# Patient Record
Sex: Male | Born: 1975 | Race: White | Hispanic: No | Marital: Single
Health system: Midwestern US, Academic
[De-identification: ages and names within clinical notes are randomized; demographics above are authoritative.]

---

## 2016-12-16 MED ORDER — LAMOTRIGINE 100 MG PO TAB
100 mg | ORAL_TABLET | Freq: Two times a day (BID) | ORAL | 11 refills | Status: DC
Start: 2016-12-16 — End: 2017-06-23

## 2016-12-16 MED ORDER — LEVETIRACETAM 100 MG/ML PO SOLN
2000 mg | Freq: Two times a day (BID) | ORAL | 11 refills | 90.00000 days | Status: DC
Start: 2016-12-16 — End: 2017-06-23

## 2016-12-16 MED ORDER — GLYCOPYRROLATE 1 MG PO TAB
1 mg | ORAL_TABLET | Freq: Three times a day (TID) | ORAL | 11 refills | 90.00000 days | Status: DC
Start: 2016-12-16 — End: 2017-02-01

## 2017-02-01 MED ORDER — DENOSUMAB 60 MG/ML SC SYRG
60 mg | Freq: Once | SUBCUTANEOUS | 0 refills | Status: CN
Start: 2017-02-01 — End: ?

## 2017-02-01 MED ORDER — LEVOTHYROXINE 50 MCG PO TAB
50 ug | ORAL_TABLET | Freq: Every day | ORAL | 3 refills | 30.00000 days | Status: DC
Start: 2017-02-01 — End: 2018-02-28

## 2017-02-23 ENCOUNTER — Encounter: Admit: 2017-02-23 | Discharge: 2017-02-23 | Payer: MEDICARE

## 2017-03-01 MED ORDER — DENOSUMAB 60 MG/ML SC SYRG
60 mg | Freq: Once | SUBCUTANEOUS | 0 refills | Status: CN
Start: 2017-03-01 — End: ?

## 2017-03-01 MED ORDER — DENOSUMAB 60 MG/ML SC SYRG
60 mg | Freq: Once | SUBCUTANEOUS | 0 refills | Status: CP
Start: 2017-03-01 — End: ?
  Administered 2017-03-01: 20:00:00 60 mg via SUBCUTANEOUS

## 2017-03-02 ENCOUNTER — Ambulatory Visit: Admit: 2017-03-01 | Discharge: 2017-03-02 | Payer: MEDICARE

## 2017-03-02 DIAGNOSIS — M818 Other osteoporosis without current pathological fracture: Principal | ICD-10-CM

## 2017-03-02 DIAGNOSIS — M810 Age-related osteoporosis without current pathological fracture: ICD-10-CM

## 2017-05-23 ENCOUNTER — Encounter: Admit: 2017-05-23 | Discharge: 2017-05-23 | Payer: MEDICARE

## 2017-05-23 ENCOUNTER — Ambulatory Visit: Admit: 2017-05-23 | Discharge: 2017-05-23 | Payer: MEDICARE

## 2017-05-23 DIAGNOSIS — K9423 Gastrostomy malfunction: Principal | ICD-10-CM

## 2017-05-23 MED ORDER — SODIUM CHLORIDE 0.9 % IV SOLP
INTRAVENOUS | 0 refills | Status: CN
Start: 2017-05-23 — End: ?

## 2017-05-27 ENCOUNTER — Ambulatory Visit: Admit: 2017-05-27 | Discharge: 2017-05-28 | Payer: MEDICARE

## 2017-05-27 ENCOUNTER — Ambulatory Visit: Admit: 2017-05-27 | Discharge: 2017-05-27 | Payer: MEDICARE

## 2017-05-27 DIAGNOSIS — Z431 Encounter for attention to gastrostomy: Principal | ICD-10-CM

## 2017-05-27 DIAGNOSIS — Q909 Down syndrome, unspecified: ICD-10-CM

## 2017-05-27 MED ORDER — DIATRIZOATE MEG-DIATRIZOAT SOD 66-10 % PO SOLN
30 mL | Freq: Once | ORAL | 0 refills | Status: CP
Start: 2017-05-27 — End: ?
  Administered 2017-05-27: 21:00:00 30 mL via ORAL

## 2017-05-27 NOTE — Procedures
Blue line consult    Brief procedure note:    Chief Complaint: Patient is here for feeding tube replacement.    There were no vitals taken for this visit.    Focused physical exam: erythema around the PEG tube, no oozing or bleeding.    Diagnosis: Down syndrome    Interventions: Prior tube was 18 Fr 2.4 cm Bard Button tube. This old tube was removed with no complications. New Bard Button G tube 18 Fr 2.4 cm placed without complications. KUB with Gastrografin ordered for PEG tube placement check.    Recommendations: can start using the tube after PEG check is complete.  Discharge Instructions: call if bleeding, oozing, or peri-tube leakage.    Case was done with Dr. Camillo Flaming.

## 2017-05-27 NOTE — Progress Notes
GI Endoscopy Walk-In Patient Nursing Assessment    Nurse:    Chief Complaint:    Condition: Chronic__x_   At Risk___  Emergency___    BP 108/56 (BP Source: Arm, Left Upper)  - Temp 36.6 C (97.9 F)  - SpO2 (!) 85%     n/a    Mental Status: Alert_x_   Oriented__   Agitated__   Disoriented__    General Appearance: Patient a 41 year old non-verbal male, who did not appear in distress. Skin color pale, skin turgor normal. No rashes or lesions.       Pain Level (scale from 1-10): Not able to assess. Patient did not appear to be in any pain.     Pain Description: n/a     Time of Last pain medication: n/a.     Peg tube replaced by Dr. Donavan Foil. X-ray with gastrografin confirmed placement post procedure. This RN administered the gastrografin via peg tube. Patient left clinic in his personal wheelchair and accompanied by his parents. He was in no distress upon leaving clinic.

## 2017-06-23 ENCOUNTER — Encounter: Admit: 2017-06-23 | Discharge: 2017-06-23 | Payer: MEDICARE

## 2017-06-23 ENCOUNTER — Ambulatory Visit: Admit: 2017-06-23 | Discharge: 2017-06-24 | Payer: MEDICARE

## 2017-06-23 MED ORDER — CLORAZEPATE DIPOTASSIUM 3.75 MG PO TAB
3.75 mg | ORAL_TABLET | Freq: Two times a day (BID) | ORAL | 5 refills | 6.00000 days | Status: AC
Start: 2017-06-23 — End: 2017-12-22

## 2017-06-23 MED ORDER — LEVETIRACETAM 100 MG/ML PO SOLN
ORAL | 11 refills | 90.00000 days | Status: AC
Start: 2017-06-23 — End: 2017-06-27

## 2017-06-23 MED ORDER — GLYCOPYRROLATE 1 MG PO TAB
1 mg | ORAL_TABLET | Freq: Three times a day (TID) | ORAL | 11 refills | 90.00000 days | Status: AC
Start: 2017-06-23 — End: 2017-12-22

## 2017-06-23 MED ORDER — LAMOTRIGINE 100 MG PO TAB
100 mg | ORAL_TABLET | Freq: Two times a day (BID) | ORAL | 11 refills | Status: AC
Start: 2017-06-23 — End: 2017-12-22

## 2017-06-23 NOTE — Progress Notes
Date of Service: 06/23/2017    Subjective:             Marcus Gibbs is a 41 y.o. male.    History of Present Illness  Marcus Gibbs presents for follow up today, accompanied by his parents. He is doing well. His parents--Marcus Gibbs and Marcus Gibbs mention a new concern for episodes which occur several times a day. Mom describes episodes as increasing in the last 6 months and sound similar to Marcus Gibbs previous note. Described as extension of the back and neck with a surprised look on his face with eyes wide open and mouth open. Eyes do not roll back. Patient's arm also flex and shake in an oscillatory pattern while flexed. Parents report episodes occur >5 times a day and each episode defined as a series of 5 shaking events over the course of 5 minutes---each instance lasting 30-40 seconds.  These are different from prior seizures of starring spells or GTC; last was at least 6 months ago. Continues on 2g keppra bid and 100mg  lamictal bid. Parents report that when he was started on keppra, he had some increase agitation which they continue to notice with increased pinching and scratching.     Parents also report pt occasionally has difficulty sleeping.   ???  He is still going to the [facility] for PT/OT.     Patient has followed with Marcus Gibbs since he was 41 years old. He first had seizure episodes when he was a baby and parents describe as petite mal. Parents report only having 4 grand mal in his lifetime.        Review of Systems  Negative/unable to be performed by patient due to Mental status.  Family reports difficulty sleeping.    Objective:         ??? bisacodyl (DULCOLAX) 10 mg rectal suppository Insert or Apply 10 mg to rectal area as directed daily as needed.   ??? Calcium Carbonate (TUMS EXTRA STRENGTH SMOOTHIES) 300 mg (750 mg) PO Chew Take 1 Tab via feeding tube daily. Crushed/feeding tube   ??? chlorhexidine gluconate (PERIDEX) 0.12 % solution Take 15 mL by mouth twice daily. ??? cholecalciferol (VITAMIN D-3) 1,000 units tablet by PEG Tube route twice daily. Patient is taking (2) tablespoons twice a week   ??? denosumab (PROLIA) 60 mg/mL syrg Inject 60 mg into area(s) as directed once. Indications: every 6 months   ??? fluticasone (FLONASE) 50 mcg/actuation nasal spray Apply 2 Sprays to each nostril as directed daily.   ??? LACTOSE-REDUCED FOOD (ENSURE PO) Take  by mouth. Patient is using (2) cans of this a day   ??? lamoTRIgine (LAMICTAL) 100 mg tablet Take 1 tablet by mouth twice daily. Take 1 tab twice daily via feeding tube   ??? levETIRAcetam (KEPPRA) 100 mg/mL oral solution Take 20 mL by mouth twice daily. Via feeding gastrostomy (Patient taking differently: Via feeding gastrostomy)   ??? levothyroxine (SYNTHROID) 50 mcg tablet Take 1 tablet by mouth daily 30 minutes before breakfast.   ??? Menthol-Zinc Oxide (CALMOSEPTINE) 0.44-20.6 % oint Apply  to affected area daily.   ??? Nutritional Supplements (CARNATION INSTANT BREAKFAST) PO Liqd Take  by mouth. 1/2 PACKAGE OF INSTANT BREAKFAST AT 9AM IN ADDITION  TO BREAKFAST, LUNCH AND SNACKS;1/2 PACKAGE OF INSTANT BREAKFAST AT 5PM, INSTANT BREAKFAST IS MIXED WITH PEDIASURE.IF Marcus Gibbs IS STILL HUNGRY AFTER 5PM HE RECEIVES MORE. Marcus Gibbs RECEIVES ALL MEDICATIONS (CRUSHED WHEN REQUIRED) IN FEEDING TUBE    ??? ranitidine(+) (ZANTAC) 15 mg/mL oral  syrup Take 75 mg by mouth twice daily.     Vitals:    06/23/17 1247   BP: 95/56   Pulse: 44   Weight: 32.4 kg (71 lb 6.4 oz)   Height: 140 cm (55.1)     Body mass index is 16.53 kg/m???.     Physical Exam  Gen: Alert and cooperative. NAD.  ???  Neuro exam:  MSE: Alert. Does not answer questions. Reaches towards objects within his grasp.  Speech: Non verbal.  CN: Pupils 3mm and reactive bilaterally. Tracks light with eyes with apparent full movements. Face symmetric.  Motor: Normal bulk and tone. Spontaneous movements of all limbs. Strength intact and full BUE, at least 4 in BLE. Reflexes: 2 and symmetric at biceps, brachioradialis, patellas.   Sensory: Normal light touch in all limbs.  Coordination: No ataxia with spontaneous arm movements.  Gait: Not tested.  ???  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 No clear seizures for 6 months. Remains on 2g keppra bid and 100mg  lamictal bid without side effects. Doing well overall. Remains on 1mg  glycopyrrolate tid for sialorrhea. Refills for lamictal, keppra, and glycopyrrolate have been sent to his pharmacy.     For agitation and sleep with start Tranxene 3.75mg  qhs for 5 days, then BID.     Labs ordered: Keppra level, Lamictal level, CMP, CBC, will follow up on results  ???  RTC in 6 months.  Patient was seen and discussed with Dr. Genevie Cheshire.

## 2017-06-24 ENCOUNTER — Encounter: Admit: 2017-06-24 | Discharge: 2017-06-24 | Payer: MEDICARE

## 2017-06-24 DIAGNOSIS — G40319 Generalized idiopathic epilepsy and epileptic syndromes, intractable, without status epilepticus: Principal | ICD-10-CM

## 2017-06-27 ENCOUNTER — Encounter: Admit: 2017-06-27 | Discharge: 2017-06-27 | Payer: MEDICARE

## 2017-06-27 DIAGNOSIS — G40319 Generalized idiopathic epilepsy and epileptic syndromes, intractable, without status epilepticus: Principal | ICD-10-CM

## 2017-06-27 LAB — LAMICTAL LEVEL: Lab: 11 ug/mL (ref >=60)

## 2017-06-27 LAB — KEPPRA (LEVETIRACETAM): Lab: 102 ug/mL — ABNORMAL HIGH (ref >=60)

## 2017-06-27 LAB — COMPREHENSIVE METABOLIC PANEL: Lab: 75 mg/dL (ref 65–99)

## 2017-06-27 LAB — CBC: Lab: 5.9 10*3/uL (ref 3.8–10.8)

## 2017-06-27 MED ORDER — LEVETIRACETAM 100 MG/ML PO SOLN
Freq: Two times a day (BID) | ORAL | 11 refills | 90.00000 days | Status: AC
Start: 2017-06-27 — End: 2017-12-22

## 2017-07-21 NOTE — Progress Notes
I have personally interviewed and examined Marcus HopeScott E Mckissack and reviewed the history, examination, impression, and plan of care as outlined by Dr.Michael Jeanell Sparrow Bellew, MD. I personally  participated in patient counseling and coordination of care.  I agree with the assessment and plan as documented by Dr. Noemi ChapelMichael R Bellew, MD.  The Tranxene was added for its antiepileptic properties as well.

## 2017-08-03 ENCOUNTER — Encounter: Admit: 2017-08-03 | Discharge: 2017-08-03 | Payer: MEDICARE

## 2017-08-24 ENCOUNTER — Encounter: Admit: 2017-08-24 | Discharge: 2017-08-24 | Payer: MEDICARE

## 2017-08-31 ENCOUNTER — Encounter: Admit: 2017-08-31 | Discharge: 2017-08-31 | Payer: MEDICARE

## 2017-08-31 DIAGNOSIS — Q899 Congenital malformation, unspecified: ICD-10-CM

## 2017-08-31 DIAGNOSIS — H9190 Unspecified hearing loss, unspecified ear: ICD-10-CM

## 2017-08-31 DIAGNOSIS — M810 Age-related osteoporosis without current pathological fracture: ICD-10-CM

## 2017-08-31 DIAGNOSIS — Z931 Gastrostomy status: ICD-10-CM

## 2017-08-31 DIAGNOSIS — K319 Disease of stomach and duodenum, unspecified: ICD-10-CM

## 2017-08-31 DIAGNOSIS — K3184 Gastroparesis: ICD-10-CM

## 2017-08-31 DIAGNOSIS — R569 Unspecified convulsions: Secondary | ICD-10-CM

## 2017-08-31 DIAGNOSIS — Q909 Down syndrome, unspecified: Principal | ICD-10-CM

## 2017-08-31 DIAGNOSIS — K5909 Other constipation: ICD-10-CM

## 2017-08-31 DIAGNOSIS — E039 Hypothyroidism, unspecified: ICD-10-CM

## 2017-08-31 DIAGNOSIS — G809 Cerebral palsy, unspecified: ICD-10-CM

## 2017-08-31 MED ORDER — DENOSUMAB 60 MG/ML SC SYRG
60 mg | Freq: Once | SUBCUTANEOUS | 0 refills | Status: CN
Start: 2017-08-31 — End: ?

## 2017-08-31 MED ORDER — DENOSUMAB 60 MG/ML SC SYRG
60 mg | Freq: Once | SUBCUTANEOUS | 0 refills | Status: CP
Start: 2017-08-31 — End: ?
  Administered 2017-08-31: 19:00:00 60 mg via SUBCUTANEOUS

## 2017-08-31 NOTE — Progress Notes
Pt received injection, no issues.   Parents helped hold down arm for injection.

## 2017-09-01 ENCOUNTER — Ambulatory Visit: Admit: 2017-08-31 | Discharge: 2017-09-01 | Payer: MEDICARE

## 2017-09-01 DIAGNOSIS — M810 Age-related osteoporosis without current pathological fracture: ICD-10-CM

## 2017-09-01 DIAGNOSIS — M818 Other osteoporosis without current pathological fracture: Principal | ICD-10-CM

## 2017-12-08 ENCOUNTER — Encounter: Admit: 2017-12-08 | Discharge: 2017-12-08 | Payer: MEDICARE

## 2017-12-08 DIAGNOSIS — H9190 Unspecified hearing loss, unspecified ear: ICD-10-CM

## 2017-12-08 DIAGNOSIS — Q909 Down syndrome, unspecified: Principal | ICD-10-CM

## 2017-12-08 DIAGNOSIS — G809 Cerebral palsy, unspecified: ICD-10-CM

## 2017-12-08 DIAGNOSIS — Q899 Congenital malformation, unspecified: ICD-10-CM

## 2017-12-08 DIAGNOSIS — K319 Disease of stomach and duodenum, unspecified: ICD-10-CM

## 2017-12-08 DIAGNOSIS — E039 Hypothyroidism, unspecified: ICD-10-CM

## 2017-12-08 DIAGNOSIS — K3184 Gastroparesis: ICD-10-CM

## 2017-12-08 DIAGNOSIS — Z931 Gastrostomy status: ICD-10-CM

## 2017-12-08 DIAGNOSIS — M810 Age-related osteoporosis without current pathological fracture: ICD-10-CM

## 2017-12-08 DIAGNOSIS — R569 Unspecified convulsions: ICD-10-CM

## 2017-12-08 DIAGNOSIS — K5909 Other constipation: ICD-10-CM

## 2017-12-22 ENCOUNTER — Ambulatory Visit: Admit: 2017-12-22 | Discharge: 2017-12-23 | Payer: MEDICARE

## 2017-12-22 ENCOUNTER — Encounter: Admit: 2017-12-22 | Discharge: 2017-12-22 | Payer: MEDICARE

## 2017-12-22 DIAGNOSIS — K319 Disease of stomach and duodenum, unspecified: ICD-10-CM

## 2017-12-22 DIAGNOSIS — H9190 Unspecified hearing loss, unspecified ear: ICD-10-CM

## 2017-12-22 DIAGNOSIS — M810 Age-related osteoporosis without current pathological fracture: ICD-10-CM

## 2017-12-22 DIAGNOSIS — Q899 Congenital malformation, unspecified: ICD-10-CM

## 2017-12-22 DIAGNOSIS — Z931 Gastrostomy status: ICD-10-CM

## 2017-12-22 DIAGNOSIS — E039 Hypothyroidism, unspecified: ICD-10-CM

## 2017-12-22 DIAGNOSIS — R569 Unspecified convulsions: ICD-10-CM

## 2017-12-22 DIAGNOSIS — K5909 Other constipation: ICD-10-CM

## 2017-12-22 DIAGNOSIS — G809 Cerebral palsy, unspecified: ICD-10-CM

## 2017-12-22 DIAGNOSIS — Q909 Down syndrome, unspecified: Principal | ICD-10-CM

## 2017-12-22 DIAGNOSIS — K3184 Gastroparesis: ICD-10-CM

## 2017-12-22 MED ORDER — LAMOTRIGINE 100 MG PO TAB
100 mg | ORAL_TABLET | Freq: Two times a day (BID) | ORAL | 11 refills | Status: AC
Start: 2017-12-22 — End: 2018-05-25

## 2017-12-22 MED ORDER — LEVETIRACETAM 100 MG/ML PO SOLN
Freq: Two times a day (BID) | ORAL | 11 refills | 90.00000 days | Status: AC
Start: 2017-12-22 — End: 2018-05-25

## 2017-12-22 MED ORDER — CLORAZEPATE DIPOTASSIUM 3.75 MG PO TAB
3.75 mg | ORAL_TABLET | Freq: Three times a day (TID) | ORAL | 5 refills | 6.00000 days | Status: AC
Start: 2017-12-22 — End: 2018-05-25

## 2017-12-22 MED ORDER — GLYCOPYRROLATE 1 MG PO TAB
1 mg | ORAL_TABLET | Freq: Three times a day (TID) | ORAL | 11 refills | 90.00000 days | Status: AC
Start: 2017-12-22 — End: 2018-05-25

## 2017-12-23 DIAGNOSIS — G40909 Epilepsy, unspecified, not intractable, without status epilepticus: ICD-10-CM

## 2017-12-23 DIAGNOSIS — G40319 Generalized idiopathic epilepsy and epileptic syndromes, intractable, without status epilepticus: Principal | ICD-10-CM

## 2018-02-13 ENCOUNTER — Ambulatory Visit: Admit: 2018-02-13 | Discharge: 2018-02-13 | Payer: MEDICARE

## 2018-02-13 DIAGNOSIS — G40109 Localization-related (focal) (partial) symptomatic epilepsy and epileptic syndromes with simple partial seizures, not intractable, without status epilepticus: Principal | ICD-10-CM

## 2018-02-27 ENCOUNTER — Encounter: Admit: 2018-02-27 | Discharge: 2018-02-27 | Payer: MEDICARE

## 2018-02-27 DIAGNOSIS — M810 Age-related osteoporosis without current pathological fracture: Principal | ICD-10-CM

## 2018-02-28 ENCOUNTER — Encounter: Admit: 2018-02-28 | Discharge: 2018-02-28 | Payer: MEDICARE

## 2018-02-28 ENCOUNTER — Ambulatory Visit: Admit: 2018-02-28 | Discharge: 2018-02-28 | Payer: MEDICARE

## 2018-02-28 DIAGNOSIS — K319 Disease of stomach and duodenum, unspecified: ICD-10-CM

## 2018-02-28 DIAGNOSIS — Z931 Gastrostomy status: ICD-10-CM

## 2018-02-28 DIAGNOSIS — Q909 Down syndrome, unspecified: Principal | ICD-10-CM

## 2018-02-28 DIAGNOSIS — R569 Unspecified convulsions: ICD-10-CM

## 2018-02-28 DIAGNOSIS — K3184 Gastroparesis: ICD-10-CM

## 2018-02-28 DIAGNOSIS — M810 Age-related osteoporosis without current pathological fracture: ICD-10-CM

## 2018-02-28 DIAGNOSIS — E038 Other specified hypothyroidism: Principal | ICD-10-CM

## 2018-02-28 DIAGNOSIS — H9190 Unspecified hearing loss, unspecified ear: ICD-10-CM

## 2018-02-28 DIAGNOSIS — G809 Cerebral palsy, unspecified: ICD-10-CM

## 2018-02-28 DIAGNOSIS — K5909 Other constipation: ICD-10-CM

## 2018-02-28 DIAGNOSIS — E039 Hypothyroidism, unspecified: ICD-10-CM

## 2018-02-28 DIAGNOSIS — Q899 Congenital malformation, unspecified: ICD-10-CM

## 2018-02-28 LAB — TSH WITH FREE T4 REFLEX: Lab: 0.9 uU/mL (ref 0.35–5.00)

## 2018-02-28 LAB — 25-OH VITAMIN D (D2 + D3): Lab: 47 ng/mL (ref 30–80)

## 2018-02-28 LAB — BASIC METABOLIC PANEL
Lab: 101 MMOL/L (ref 98–110)
Lab: 13 mg/dL (ref 7–25)
Lab: 138 MMOL/L (ref 137–147)
Lab: 3.8 MMOL/L (ref 3.5–5.1)
Lab: 31 MMOL/L — ABNORMAL HIGH (ref 21–30)
Lab: 85 mg/dL (ref 70–100)
Lab: 9.5 mg/dL (ref 8.5–10.6)
Lab: >60 mL/min (ref >60)
Lab: >60 mL/min (ref >60)
Lab: 6 (ref 3–12)

## 2018-02-28 MED ORDER — LEVOTHYROXINE 50 MCG PO TAB
50 ug | ORAL_TABLET | Freq: Every day | ORAL | 3 refills | 30.00000 days | Status: AC
Start: 2018-02-28 — End: 2018-02-28

## 2018-02-28 MED ORDER — DENOSUMAB 60 MG/ML SC SYRG
60 mg | Freq: Once | SUBCUTANEOUS | 0 refills | Status: CN
Start: 2018-02-28 — End: ?

## 2018-02-28 MED ORDER — LEVOTHYROXINE 50 MCG PO TAB
50 ug | ORAL_TABLET | Freq: Every day | ORAL | 3 refills | 30.00000 days | Status: AC
Start: 2018-02-28 — End: 2018-03-01

## 2018-03-01 ENCOUNTER — Encounter: Admit: 2018-03-01 | Discharge: 2018-03-01 | Payer: MEDICARE

## 2018-03-01 MED ORDER — LEVOTHYROXINE 50 MCG PO TAB
50 ug | ORAL_TABLET | Freq: Every day | ORAL | 3 refills | 30.00000 days | Status: AC
Start: 2018-03-01 — End: 2019-02-27

## 2018-03-03 ENCOUNTER — Encounter: Admit: 2018-03-03 | Discharge: 2018-03-03 | Payer: MEDICARE

## 2018-03-06 ENCOUNTER — Ambulatory Visit: Admit: 2018-03-06 | Discharge: 2018-03-07 | Payer: MEDICARE

## 2018-03-06 ENCOUNTER — Encounter: Admit: 2018-03-06 | Discharge: 2018-03-06 | Payer: MEDICARE

## 2018-03-06 DIAGNOSIS — K3184 Gastroparesis: ICD-10-CM

## 2018-03-06 DIAGNOSIS — Q909 Down syndrome, unspecified: Principal | ICD-10-CM

## 2018-03-06 DIAGNOSIS — G809 Cerebral palsy, unspecified: ICD-10-CM

## 2018-03-06 DIAGNOSIS — Q899 Congenital malformation, unspecified: ICD-10-CM

## 2018-03-06 DIAGNOSIS — M810 Age-related osteoporosis without current pathological fracture: ICD-10-CM

## 2018-03-06 DIAGNOSIS — E039 Hypothyroidism, unspecified: ICD-10-CM

## 2018-03-06 DIAGNOSIS — K319 Disease of stomach and duodenum, unspecified: ICD-10-CM

## 2018-03-06 DIAGNOSIS — K5909 Other constipation: ICD-10-CM

## 2018-03-06 DIAGNOSIS — H9190 Unspecified hearing loss, unspecified ear: ICD-10-CM

## 2018-03-06 DIAGNOSIS — Z931 Gastrostomy status: ICD-10-CM

## 2018-03-06 DIAGNOSIS — R569 Unspecified convulsions: Secondary | ICD-10-CM

## 2018-03-06 MED ORDER — DENOSUMAB 60 MG/ML SC SYRG
60 mg | Freq: Once | SUBCUTANEOUS | 0 refills | Status: CN
Start: 2018-03-06 — End: ?

## 2018-03-06 MED ORDER — DENOSUMAB 60 MG/ML SC SYRG
60 mg | Freq: Once | SUBCUTANEOUS | 0 refills | Status: CP
Start: 2018-03-06 — End: ?
  Administered 2018-03-06: 17:00:00 60 mg via SUBCUTANEOUS

## 2018-03-07 DIAGNOSIS — M810 Age-related osteoporosis without current pathological fracture: Principal | ICD-10-CM

## 2018-05-25 ENCOUNTER — Ambulatory Visit: Admit: 2018-05-25 | Discharge: 2018-05-26 | Payer: MEDICARE

## 2018-05-25 ENCOUNTER — Encounter: Admit: 2018-05-25 | Discharge: 2018-05-25 | Payer: MEDICARE

## 2018-05-25 DIAGNOSIS — K3184 Gastroparesis: ICD-10-CM

## 2018-05-25 DIAGNOSIS — G479 Sleep disorder, unspecified: Secondary | ICD-10-CM

## 2018-05-25 DIAGNOSIS — R569 Unspecified convulsions: Secondary | ICD-10-CM

## 2018-05-25 DIAGNOSIS — E039 Hypothyroidism, unspecified: ICD-10-CM

## 2018-05-25 DIAGNOSIS — Z931 Gastrostomy status: ICD-10-CM

## 2018-05-25 DIAGNOSIS — M810 Age-related osteoporosis without current pathological fracture: ICD-10-CM

## 2018-05-25 DIAGNOSIS — Q899 Congenital malformation, unspecified: ICD-10-CM

## 2018-05-25 DIAGNOSIS — K5909 Other constipation: ICD-10-CM

## 2018-05-25 DIAGNOSIS — H9190 Unspecified hearing loss, unspecified ear: ICD-10-CM

## 2018-05-25 DIAGNOSIS — K319 Disease of stomach and duodenum, unspecified: ICD-10-CM

## 2018-05-25 DIAGNOSIS — Q909 Down syndrome, unspecified: Principal | ICD-10-CM

## 2018-05-25 DIAGNOSIS — G809 Cerebral palsy, unspecified: ICD-10-CM

## 2018-05-25 MED ORDER — LAMOTRIGINE 100 MG PO TAB
100 mg | ORAL_TABLET | Freq: Two times a day (BID) | ORAL | 11 refills | Status: AC
Start: 2018-05-25 — End: 2018-10-26

## 2018-05-25 MED ORDER — MELATONIN 5 MG/15 ML PO LIQD
5 mg | Freq: Every evening | GASTROSTOMY | 11 refills | 28.00000 days | Status: AC
Start: 2018-05-25 — End: 2019-05-30

## 2018-05-25 MED ORDER — GLYCOPYRROLATE 1 MG PO TAB
1 mg | ORAL_TABLET | Freq: Three times a day (TID) | ORAL | 11 refills | 90.00000 days | Status: AC
Start: 2018-05-25 — End: 2018-10-26

## 2018-05-25 MED ORDER — CLORAZEPATE DIPOTASSIUM 3.75 MG PO TAB
3.75 mg | ORAL_TABLET | Freq: Three times a day (TID) | ORAL | 5 refills | 6.00000 days | Status: AC
Start: 2018-05-25 — End: 2018-10-26

## 2018-05-25 MED ORDER — LEVETIRACETAM 100 MG/ML PO SOLN
Freq: Two times a day (BID) | ORAL | 11 refills | 90.00000 days | Status: AC
Start: 2018-05-25 — End: 2018-10-26

## 2018-05-26 ENCOUNTER — Encounter: Admit: 2018-05-26 | Discharge: 2018-05-26 | Payer: MEDICARE

## 2018-05-26 DIAGNOSIS — G40319 Generalized idiopathic epilepsy and epileptic syndromes, intractable, without status epilepticus: ICD-10-CM

## 2018-05-26 DIAGNOSIS — G40909 Epilepsy, unspecified, not intractable, without status epilepticus: Principal | ICD-10-CM

## 2018-05-26 LAB — CBC
Lab: 101 FL — ABNORMAL HIGH (ref 80–100)
Lab: 13 % (ref 11–15)
Lab: 14 g/dL (ref 13.5–16.5)
Lab: 258 K/UL (ref 150–400)
Lab: 33 g/dL (ref 32.0–36.0)
Lab: 33 pg (ref 26–34)
Lab: 4.4 M/UL (ref 4.4–5.5)
Lab: 44 % (ref 40–50)
Lab: 6.3 K/UL (ref 4.5–11.0)
Lab: 8.3 FL (ref 7–11)

## 2018-05-26 LAB — COMPREHENSIVE METABOLIC PANEL
Lab: 140 MMOL/L (ref 137–147)
Lab: 29 MMOL/L (ref 21–30)
Lab: 37 U/L (ref 7–40)
Lab: 40 U/L (ref 7–56)
Lab: 8 (ref 3–12)
Lab: >60 mL/min (ref >60)
Lab: >60 mL/min (ref >60)

## 2018-08-31 ENCOUNTER — Encounter: Admit: 2018-08-31 | Discharge: 2018-08-31 | Payer: MEDICARE

## 2018-09-07 ENCOUNTER — Ambulatory Visit: Admit: 2018-09-07 | Discharge: 2018-09-08 | Payer: MEDICARE

## 2018-09-07 MED ORDER — DENOSUMAB 60 MG/ML SC SYRG
60 mg | Freq: Once | SUBCUTANEOUS | 0 refills | Status: CN
Start: 2018-09-07 — End: ?

## 2018-09-07 MED ORDER — DENOSUMAB 60 MG/ML SC SYRG
60 mg | Freq: Once | SUBCUTANEOUS | 0 refills | Status: CP
Start: 2018-09-07 — End: ?
  Administered 2018-09-07: 17:00:00 60 mg via SUBCUTANEOUS

## 2018-09-08 DIAGNOSIS — M810 Age-related osteoporosis without current pathological fracture: Secondary | ICD-10-CM

## 2018-09-13 ENCOUNTER — Encounter: Admit: 2018-09-13 | Discharge: 2018-09-13 | Payer: MEDICARE

## 2018-09-26 ENCOUNTER — Encounter: Admit: 2018-09-26 | Discharge: 2018-09-26 | Payer: MEDICARE

## 2018-09-26 DIAGNOSIS — Q899 Congenital malformation, unspecified: Secondary | ICD-10-CM

## 2018-09-26 DIAGNOSIS — H9190 Unspecified hearing loss, unspecified ear: Secondary | ICD-10-CM

## 2018-09-26 DIAGNOSIS — Q909 Down syndrome, unspecified: Secondary | ICD-10-CM

## 2018-09-26 DIAGNOSIS — K3184 Gastroparesis: Secondary | ICD-10-CM

## 2018-09-26 DIAGNOSIS — M810 Age-related osteoporosis without current pathological fracture: Secondary | ICD-10-CM

## 2018-09-26 DIAGNOSIS — G809 Cerebral palsy, unspecified: Secondary | ICD-10-CM

## 2018-09-26 DIAGNOSIS — R339 Retention of urine, unspecified: Secondary | ICD-10-CM

## 2018-09-26 DIAGNOSIS — E039 Hypothyroidism, unspecified: Secondary | ICD-10-CM

## 2018-09-26 DIAGNOSIS — K5909 Other constipation: Secondary | ICD-10-CM

## 2018-09-26 DIAGNOSIS — R569 Unspecified convulsions: Secondary | ICD-10-CM

## 2018-09-26 DIAGNOSIS — K319 Disease of stomach and duodenum, unspecified: Secondary | ICD-10-CM

## 2018-09-26 DIAGNOSIS — N390 Urinary tract infection, site not specified: Secondary | ICD-10-CM

## 2018-09-26 DIAGNOSIS — Z931 Gastrostomy status: Secondary | ICD-10-CM

## 2018-09-26 DIAGNOSIS — N35914 Unspecified anterior urethral stricture, male: Secondary | ICD-10-CM

## 2018-09-26 DIAGNOSIS — N35814 Other anterior urethral stricture, male: Secondary | ICD-10-CM

## 2018-09-28 ENCOUNTER — Encounter: Admit: 2018-09-28 | Discharge: 2018-09-28 | Payer: MEDICARE

## 2018-09-28 DIAGNOSIS — K5909 Other constipation: Secondary | ICD-10-CM

## 2018-09-28 DIAGNOSIS — Q909 Down syndrome, unspecified: Secondary | ICD-10-CM

## 2018-09-28 DIAGNOSIS — G809 Cerebral palsy, unspecified: Secondary | ICD-10-CM

## 2018-09-28 DIAGNOSIS — R569 Unspecified convulsions: Secondary | ICD-10-CM

## 2018-09-28 DIAGNOSIS — H9190 Unspecified hearing loss, unspecified ear: Secondary | ICD-10-CM

## 2018-09-28 DIAGNOSIS — K3184 Gastroparesis: Secondary | ICD-10-CM

## 2018-09-28 DIAGNOSIS — Q899 Congenital malformation, unspecified: Secondary | ICD-10-CM

## 2018-09-28 DIAGNOSIS — K319 Disease of stomach and duodenum, unspecified: Secondary | ICD-10-CM

## 2018-09-28 DIAGNOSIS — Z931 Gastrostomy status: Secondary | ICD-10-CM

## 2018-09-28 DIAGNOSIS — M810 Age-related osteoporosis without current pathological fracture: Secondary | ICD-10-CM

## 2018-09-28 DIAGNOSIS — E039 Hypothyroidism, unspecified: Secondary | ICD-10-CM

## 2018-10-10 ENCOUNTER — Encounter: Admit: 2018-10-10 | Discharge: 2018-10-10 | Payer: MEDICARE

## 2018-10-10 ENCOUNTER — Ambulatory Visit: Admit: 2018-10-10 | Discharge: 2018-10-10 | Payer: MEDICARE

## 2018-10-10 DIAGNOSIS — Z931 Gastrostomy status: Principal | ICD-10-CM

## 2018-10-10 MED ORDER — SODIUM CHLORIDE 0.9 % IV SOLP
INTRAVENOUS | 0 refills | Status: CN
Start: 2018-10-10 — End: ?

## 2018-10-10 NOTE — Telephone Encounter
Message from Mother [name] asking for order to have feeding tube changed due to leaking a lot. Confirms able to use tube for nutrition and meds without difficulty.  Call back number for Mother is [phone] who also states is a patient of Dr. Bari Mantis. .  Review of chart, patient last seen in our office Dr. Molloy/Dr. Jomarie Longs 05/30/2014.  Previous tube change out 05/27/2017 Dr. Georgina Quint G tube 18 Fr 2.4 cm placed without complications. Mother has the replacement tube and will bring once we have order.  Routing to Dr. Jomarie Longs and Dr. Francisco Capuchin for consideration of order.

## 2018-10-16 ENCOUNTER — Ambulatory Visit: Admit: 2018-10-16 | Discharge: 2018-10-16 | Payer: MEDICARE

## 2018-10-16 ENCOUNTER — Ambulatory Visit: Admit: 2018-10-16 | Discharge: 2018-10-17 | Payer: MEDICARE

## 2018-10-16 DIAGNOSIS — E039 Hypothyroidism, unspecified: ICD-10-CM

## 2018-10-16 DIAGNOSIS — Z8249 Family history of ischemic heart disease and other diseases of the circulatory system: ICD-10-CM

## 2018-10-16 DIAGNOSIS — Z431 Encounter for attention to gastrostomy: Principal | ICD-10-CM

## 2018-10-16 MED ORDER — DIATRIZOATE MEG-DIATRIZOAT SOD 66-10 % PO SOLN
30 mL | Freq: Once | GASTROSTOMY | 0 refills | Status: DC
Start: 2018-10-16 — End: 2018-11-03

## 2018-10-16 NOTE — Progress Notes
GI Endoscopy Walk-In Patient Nursing Assessment    Nurse: Judeth Cornfield    Chief Complaint: Peg replacement    Condition: Chronic___       T:  36.5  HR:  44  RR:  13  BP:  98/55  SpO2:  100%    for greater than 4 hours     Mental Status: Alert        General Appearance:    Skin color, texture, turgor normal. No rashes or lesions    Pain Level (scale from 1-10):    Pain Description:    Time of Last pain medication:

## 2018-10-26 ENCOUNTER — Encounter: Admit: 2018-10-26 | Discharge: 2018-10-26 | Payer: MEDICARE

## 2018-10-26 ENCOUNTER — Ambulatory Visit: Admit: 2018-10-26 | Discharge: 2018-10-27 | Payer: MEDICARE

## 2018-10-26 DIAGNOSIS — Q909 Down syndrome, unspecified: Principal | ICD-10-CM

## 2018-10-26 DIAGNOSIS — Q899 Congenital malformation, unspecified: ICD-10-CM

## 2018-10-26 DIAGNOSIS — G809 Cerebral palsy, unspecified: ICD-10-CM

## 2018-10-26 DIAGNOSIS — E039 Hypothyroidism, unspecified: ICD-10-CM

## 2018-10-26 DIAGNOSIS — G40319 Generalized idiopathic epilepsy and epileptic syndromes, intractable, without status epilepticus: Secondary | ICD-10-CM

## 2018-10-26 DIAGNOSIS — R569 Unspecified convulsions: Secondary | ICD-10-CM

## 2018-10-26 DIAGNOSIS — M810 Age-related osteoporosis without current pathological fracture: ICD-10-CM

## 2018-10-26 DIAGNOSIS — K319 Disease of stomach and duodenum, unspecified: ICD-10-CM

## 2018-10-26 DIAGNOSIS — H9190 Unspecified hearing loss, unspecified ear: ICD-10-CM

## 2018-10-26 DIAGNOSIS — K3184 Gastroparesis: ICD-10-CM

## 2018-10-26 DIAGNOSIS — K5909 Other constipation: ICD-10-CM

## 2018-10-26 DIAGNOSIS — Z931 Gastrostomy status: ICD-10-CM

## 2018-10-26 MED ORDER — CLORAZEPATE DIPOTASSIUM 3.75 MG PO TAB
3.75 mg | ORAL_TABLET | Freq: Three times a day (TID) | ORAL | 5 refills | 6.00000 days | Status: AC
Start: 2018-10-26 — End: 2019-05-03

## 2018-10-26 MED ORDER — LAMOTRIGINE 100 MG PO TAB
100 mg | ORAL_TABLET | Freq: Two times a day (BID) | ORAL | 11 refills | Status: AC
Start: 2018-10-26 — End: 2019-05-03

## 2018-10-26 MED ORDER — LEVETIRACETAM 100 MG/ML PO SOLN
Freq: Two times a day (BID) | ORAL | 11 refills | 90.00000 days | Status: AC
Start: 2018-10-26 — End: 2018-11-22

## 2018-10-26 MED ORDER — GLYCOPYRROLATE 1 MG PO TAB
1 mg | ORAL_TABLET | Freq: Three times a day (TID) | ORAL | 11 refills | 90.00000 days | Status: AC
Start: 2018-10-26 — End: 2019-05-03

## 2018-10-27 DIAGNOSIS — R451 Restlessness and agitation: ICD-10-CM

## 2018-10-27 DIAGNOSIS — R400 Somnolence: Secondary | ICD-10-CM

## 2018-10-27 DIAGNOSIS — G4700 Insomnia, unspecified: Principal | ICD-10-CM

## 2018-10-27 DIAGNOSIS — R718 Other abnormality of red blood cells: ICD-10-CM

## 2018-10-27 LAB — VITAMIN B12: Lab: 538 pg/mL (ref 180–914)

## 2018-11-22 ENCOUNTER — Encounter: Admit: 2018-11-22 | Discharge: 2018-11-22 | Payer: MEDICARE

## 2018-11-22 DIAGNOSIS — G40319 Generalized idiopathic epilepsy and epileptic syndromes, intractable, without status epilepticus: Principal | ICD-10-CM

## 2018-11-22 MED ORDER — LEVETIRACETAM 100 MG/ML PO SOLN
Freq: Two times a day (BID) | 11 refills
Start: 2018-11-22 — End: ?

## 2018-11-22 MED ORDER — LEVETIRACETAM 100 MG/ML PO SOLN
Freq: Two times a day (BID) | ORAL | 11 refills | 90.00000 days | Status: AC
Start: 2018-11-22 — End: 2019-05-03

## 2019-02-26 NOTE — Progress Notes
Marcus Gibbs is a 43 y.o. male.    Subjective:             History of Present Illness      Patient is a 43 year old male with history of Down Syndrome, seizure disorder, hypothyroidism and osteoporosis who presented to endocrinology clinic for follow up of his osteoporosis.  He has gotten IV Reclast infusions in the past for 5 years per his mother who is in the room with him.  Patient does not communicate and is unable to provide any history of review of systems.  He has seizures for which he follows with Dr. Louanne Gibbs in neurology, and is currently controlled on levetiracetam and Lamictal.  He takes some nutrition through a PEG tube in addition to all his medications.    Last Prolia injection 10/01/16. No falls. Tolerating medications.        Review of Systems   Unable to perform ROS: Other   Patient is non-communicative secondary to Down Syndrome      Objective:         ??? bisacodyl (DULCOLAX) 10 mg rectal suppository Insert or Apply 10 mg to rectal area as directed daily as needed.   ??? Calcium Carbonate (TUMS EXTRA STRENGTH SMOOTHIES) 300 mg (750 mg) PO Chew Take 1 Tab via feeding tube daily. Crushed/feeding tube   ??? chlorhexidine gluconate (PERIDEX) 0.12 % solution Take 15 mL by mouth twice daily.   ??? cholecalciferol (VITAMIN D-3) 1,000 units tablet by PEG Tube route twice daily. Patient is taking (2) tablespoons twice a week   ??? clorazepate (TRANXENE) 3.75 mg tablet Take one tablet by mouth three times daily. 7.5mg  am; 3.75mg  pm   ??? denosumab (PROLIA) 60 mg/mL syrg Inject 60 mg into area(s) as directed once. Indications: every 6 months   ??? fluticasone (FLONASE) 50 mcg/actuation nasal spray Apply 2 Sprays to each nostril as directed daily.   ??? glycopyrrolate (ROBINUL) 1 mg tablet Take one tablet by mouth three times daily.   ??? LACTOSE-REDUCED FOOD (ENSURE PO) Take  by mouth. Patient is using (2) cans of this a day   ??? lamoTRIgine (LAMICTAL) 100 mg tablet Take one tablet by mouth twice daily. Take 1 tab twice daily via feeding tube   ??? levETIRAcetam (KEPPRA) 100 mg/mL oral solution Give 20ml Via feeding gastrostomy twice daily   ??? levothyroxine (SYNTHROID) 50 mcg tablet Take one tablet by mouth daily 30 minutes before breakfast.   ??? melatonin 5 mg/15 mL liqd 5 mg by PEG Tube route at bedtime daily.   ??? Menthol-Zinc Oxide (CALMOSEPTINE) 0.44-20.6 % oint Apply  to affected area daily.   ??? Nutritional Supplements (CARNATION INSTANT BREAKFAST) PO Liqd Take  by mouth. 1/2 PACKAGE OF INSTANT BREAKFAST AT 9AM IN ADDITION  TO BREAKFAST, LUNCH AND SNACKS;1/2 PACKAGE OF INSTANT BREAKFAST AT 5PM, INSTANT BREAKFAST IS MIXED WITH PEDIASURE.IF Bon IS STILL HUNGRY AFTER 5PM HE RECEIVES MORE. Marcus Gibbs RECEIVES ALL MEDICATIONS (CRUSHED WHEN REQUIRED) IN FEEDING TUBE    ??? ranitidine(+) (ZANTAC) 15 mg/mL oral syrup Take 75 mg by mouth twice daily.       There is no height or weight on file to calculate BMI.       Physical Exam   Constitutional: He appears well-nourished. No distress.   Patient is non-communicative, sitting in a wheelchair, awake and moves upper extremities but does not interact.   Neck: Neck supple. No JVD present. No tracheal deviation present. No thyromegaly present.   Cardiovascular: Normal  rate, regular rhythm and normal heart sounds. Exam reveals no gallop and no friction rub.   No murmur heard.  Pulmonary/Chest: Effort normal and breath sounds normal. No respiratory distress. He has no wheezes. He has no rales.   Abdominal: Soft. Bowel sounds are normal. He exhibits no distension. There is no abdominal tenderness. There is no rebound and no guarding.   Skin: Skin is warm and dry. He is not diaphoretic.            Assessment and Plan:       Down Syndrome    Patient does not communicate and is dependent on his parents for all his care.  Does not ambulate, uses wheelchair for mobility.      Seizures    Follows with neurology.  Currently on levetiracetam and Lamictal.     Hypothyroidism Check TSH today with labs and continue on levothyroxine.  Will call patient's family to inform of any dosage changes, if needed.     Osteoporosis      I personally reviewed the repeat Bone Density this individual due to COVID we have canceled the bone density for this year.  We will continue with the Prolia      His last dose of Prolia was Jan 2020 he has had to close to 4 years.        ([id number])       Continue prolia at this time. Order placed through infusion center.   Check BMP, Vit D, TSH today.                    ([id number])           Return to clinic in one year.    Patient seen and discussed with Dr. Barbara Cower

## 2019-02-27 ENCOUNTER — Encounter: Admit: 2019-02-27 | Discharge: 2019-02-27

## 2019-02-27 DIAGNOSIS — K319 Disease of stomach and duodenum, unspecified: Secondary | ICD-10-CM

## 2019-02-27 DIAGNOSIS — Q899 Congenital malformation, unspecified: Secondary | ICD-10-CM

## 2019-02-27 DIAGNOSIS — G809 Cerebral palsy, unspecified: Secondary | ICD-10-CM

## 2019-02-27 DIAGNOSIS — Q909 Down syndrome, unspecified: Secondary | ICD-10-CM

## 2019-02-27 DIAGNOSIS — H9190 Unspecified hearing loss, unspecified ear: Secondary | ICD-10-CM

## 2019-02-27 DIAGNOSIS — E039 Hypothyroidism, unspecified: Secondary | ICD-10-CM

## 2019-02-27 DIAGNOSIS — M810 Age-related osteoporosis without current pathological fracture: Secondary | ICD-10-CM

## 2019-02-27 DIAGNOSIS — R569 Unspecified convulsions: Secondary | ICD-10-CM

## 2019-02-27 DIAGNOSIS — K5909 Other constipation: Secondary | ICD-10-CM

## 2019-02-27 DIAGNOSIS — Z931 Gastrostomy status: Secondary | ICD-10-CM

## 2019-02-27 DIAGNOSIS — K3184 Gastroparesis: Secondary | ICD-10-CM

## 2019-02-27 MED ORDER — LEVOTHYROXINE 50 MCG PO TAB
50 ug | ORAL_TABLET | Freq: Every day | ORAL | 3 refills | 30.00000 days | Status: DC
Start: 2019-02-27 — End: 2019-03-12

## 2019-02-27 MED ORDER — DENOSUMAB 60 MG/ML SC SYRG
60 mg | Freq: Once | SUBCUTANEOUS | 0 refills | Status: CN
Start: 2019-02-27 — End: ?

## 2019-02-27 NOTE — Patient Instructions
No bone density this year    Repeat labs close to home.       We will mail you the labs    Recommend to continue prolia

## 2019-02-28 ENCOUNTER — Ambulatory Visit: Admit: 2019-02-27 | Discharge: 2019-02-28

## 2019-02-28 DIAGNOSIS — M818 Other osteoporosis without current pathological fracture: Principal | ICD-10-CM

## 2019-03-05 ENCOUNTER — Encounter: Admit: 2019-03-05 | Discharge: 2019-03-05

## 2019-03-05 DIAGNOSIS — M818 Other osteoporosis without current pathological fracture: Secondary | ICD-10-CM

## 2019-03-05 DIAGNOSIS — E039 Hypothyroidism, unspecified: Secondary | ICD-10-CM

## 2019-03-05 LAB — BASIC METABOLIC PANEL
Lab: 109 (ref >60)
Lab: 0.8 mg/dL (ref 0.72–1.25)
Lab: 104 mmol/L (ref 99–107)
Lab: 11 meq/L (ref 0–14)
Lab: 12 mg/dL (ref 8.9–20.6)
Lab: 139 mmol/L (ref 136–145)
Lab: 28 mmol/L (ref 22–29)
Lab: 4.2 mmol/L (ref 3.5–5.1)
Lab: 61 mg/dL — AB (ref 70–105)
Lab: 9.3 mg/dL (ref 8.4–10.2)

## 2019-03-05 LAB — 25-OH VITAMIN D (D2 + D3): Lab: 42 (ref 30–100)

## 2019-03-05 LAB — TSH WITH FREE T4 REFLEX: Lab: 1 m[IU]/mL (ref 0.35–4.94)

## 2019-03-08 ENCOUNTER — Encounter: Admit: 2019-03-08 | Discharge: 2019-03-08

## 2019-03-08 NOTE — Telephone Encounter
See labs form 03/05/2019 entered today, Vitamin D

## 2019-03-12 ENCOUNTER — Encounter: Admit: 2019-03-12 | Discharge: 2019-03-12

## 2019-03-12 MED ORDER — LEVOTHYROXINE 50 MCG PO TAB
50 ug | ORAL_TABLET | Freq: Every day | ORAL | 3 refills | 30.00000 days | Status: DC
Start: 2019-03-12 — End: 2019-12-27

## 2019-03-12 NOTE — Telephone Encounter
I have reviewed all lab results, which are normal or stable.

## 2019-03-12 NOTE — Telephone Encounter
Called Patient spoke with Patients Mom relayed message Mom VU

## 2019-03-13 ENCOUNTER — Ambulatory Visit: Admit: 2019-03-13 | Discharge: 2019-03-14

## 2019-03-13 ENCOUNTER — Encounter: Admit: 2019-03-13 | Discharge: 2019-03-13

## 2019-03-13 DIAGNOSIS — K5909 Other constipation: Secondary | ICD-10-CM

## 2019-03-13 DIAGNOSIS — Q899 Congenital malformation, unspecified: Secondary | ICD-10-CM

## 2019-03-13 DIAGNOSIS — R569 Unspecified convulsions: Secondary | ICD-10-CM

## 2019-03-13 DIAGNOSIS — K3184 Gastroparesis: Secondary | ICD-10-CM

## 2019-03-13 DIAGNOSIS — G809 Cerebral palsy, unspecified: Secondary | ICD-10-CM

## 2019-03-13 DIAGNOSIS — H9190 Unspecified hearing loss, unspecified ear: Secondary | ICD-10-CM

## 2019-03-13 DIAGNOSIS — Z931 Gastrostomy status: Secondary | ICD-10-CM

## 2019-03-13 DIAGNOSIS — K319 Disease of stomach and duodenum, unspecified: Secondary | ICD-10-CM

## 2019-03-13 DIAGNOSIS — M810 Age-related osteoporosis without current pathological fracture: Principal | ICD-10-CM

## 2019-03-13 DIAGNOSIS — Q909 Down syndrome, unspecified: Secondary | ICD-10-CM

## 2019-03-13 DIAGNOSIS — E039 Hypothyroidism, unspecified: Secondary | ICD-10-CM

## 2019-03-13 MED ORDER — DENOSUMAB 60 MG/ML SC SYRG
60 mg | Freq: Once | SUBCUTANEOUS | 0 refills | Status: CP
Start: 2019-03-13 — End: ?
  Administered 2019-03-13: 19:00:00 60 mg via SUBCUTANEOUS

## 2019-03-13 MED ORDER — DENOSUMAB 60 MG/ML SC SYRG
60 mg | Freq: Once | SUBCUTANEOUS | 0 refills | Status: CN
Start: 2019-03-13 — End: ?

## 2019-03-13 NOTE — Patient Instructions
Denosumab injection  Brand Names: Prolia, XGEVA  What is this medicine?  DENOSUMAB (den oh sue mab) slows bone breakdown. Prolia is used to treat osteoporosis in women after menopause and in men, and in people who are taking corticosteroids for 6 months or more. Xgeva is used to treat a high calcium level due to cancer and to prevent bone fractures and other bone problems caused by multiple myeloma or cancer bone metastases. Xgeva is also used to treat giant cell tumor of the bone.  How should I use this medicine?  This medicine is for injection under the skin. It is given by a health care professional in a hospital or clinic setting.  A special MedGuide will be given to you before each treatment. Be sure to read this information carefully each time.  For Prolia, talk to your pediatrician regarding the use of this medicine in children. Special care may be needed. For Xgeva, talk to your pediatrician regarding the use of this medicine in children. While this drug may be prescribed for children as young as 13 years for selected conditions, precautions do apply.  What side effects may I notice from receiving this medicine?  Side effects that you should report to your doctor or health care professional as soon as possible:   allergic reactions like skin rash, itching or hives, swelling of the face, lips, or tongue   bone pain   breathing problems   dizziness   jaw pain, especially after dental work   redness, blistering, peeling of the skin   signs and symptoms of infection like fever or chills; cough; sore throat; pain or trouble passing urine   signs of low calcium like fast heartbeat, muscle cramps or muscle pain; pain, tingling, numbness in the hands or feet; seizures   unusual bleeding or bruising   unusually weak or tired  Side effects that usually do not require medical attention (report to your doctor or health care professional if they continue or are  bothersome):   constipation   diarrhea   headache   joint pain   loss of appetite   muscle pain   runny nose   tiredness   upset stomach  What may interact with this medicine?  Do not take this medicine with any of the following medications:   other medicines containing denosumab  This medicine may also interact with the following medications:   medicines that lower your chance of fighting infection   steroid medicines like prednisone or cortisone  What if I miss a dose?  It is important not to miss your dose. Call your doctor or health care professional if you are unable to keep an appointment.  Where should I keep my medicine?  This medicine is only given in a clinic, doctor's office, or other health care setting and will not be stored at home.  What should I tell my health care provider before I take this medicine?  They need to know if you have any of these conditions:   dental disease   having surgery or tooth extraction   infection   kidney disease   low levels of calcium or Vitamin D in the blood   malnutrition   on hemodialysis   skin conditions or sensitivity   thyroid or parathyroid disease   an unusual reaction to denosumab, other medicines, foods, dyes, or preservatives   pregnant or trying to get pregnant   breast-feeding  What should I watch for while using this medicine?    Visit your doctor or health care professional for regular checks on your progress. Your doctor or health care professional may order blood tests and other tests to see how you are doing.  Call your doctor or health care professional for advice if you get a fever, chills or sore throat, or other symptoms of a cold or flu. Do not treat yourself. This drug may decrease your body's ability to fight infection. Try to avoid being around people who are sick.  You should make sure you get enough calcium and vitamin D while you are taking this medicine, unless your doctor tells you not to. Discuss the foods you eat and  the vitamins you take with your health care professional.  See your dentist regularly. Brush and floss your teeth as directed. Before you have any dental work done, tell your dentist you are receiving this medicine.  Do not become pregnant while taking this medicine or for 5 months after stopping it. Talk with your doctor or health care professional about your birth control options while taking this medicine. Women should inform their doctor if they wish to become pregnant or think they might be pregnant. There is a potential for serious side effects to an unborn child. Talk to your health care professional or pharmacist for more information.  NOTE:This sheet is a summary. It may not cover all possible information. If you have questions about this medicine, talk to your doctor, pharmacist, or health care provider. Copyright 2020 [employer]

## 2019-03-13 NOTE — Progress Notes
Patient tolerated injection, no reaction noted.  In wheelchair, accompanied by mother.

## 2019-05-03 ENCOUNTER — Ambulatory Visit: Admit: 2019-05-03 | Discharge: 2019-05-04

## 2019-05-03 ENCOUNTER — Encounter: Admit: 2019-05-03 | Discharge: 2019-05-03

## 2019-05-03 DIAGNOSIS — H9190 Unspecified hearing loss, unspecified ear: Secondary | ICD-10-CM

## 2019-05-03 DIAGNOSIS — R569 Unspecified convulsions: Secondary | ICD-10-CM

## 2019-05-03 DIAGNOSIS — K5909 Other constipation: Secondary | ICD-10-CM

## 2019-05-03 DIAGNOSIS — Z931 Gastrostomy status: Secondary | ICD-10-CM

## 2019-05-03 DIAGNOSIS — K319 Disease of stomach and duodenum, unspecified: Secondary | ICD-10-CM

## 2019-05-03 DIAGNOSIS — Q909 Down syndrome, unspecified: Secondary | ICD-10-CM

## 2019-05-03 DIAGNOSIS — M810 Age-related osteoporosis without current pathological fracture: Secondary | ICD-10-CM

## 2019-05-03 DIAGNOSIS — E039 Hypothyroidism, unspecified: Secondary | ICD-10-CM

## 2019-05-03 DIAGNOSIS — Q899 Congenital malformation, unspecified: Secondary | ICD-10-CM

## 2019-05-03 DIAGNOSIS — K3184 Gastroparesis: Secondary | ICD-10-CM

## 2019-05-03 DIAGNOSIS — G809 Cerebral palsy, unspecified: Secondary | ICD-10-CM

## 2019-05-03 DIAGNOSIS — G40319 Generalized idiopathic epilepsy and epileptic syndromes, intractable, without status epilepticus: Principal | ICD-10-CM

## 2019-05-03 MED ORDER — LEVETIRACETAM 100 MG/ML PO SOLN
Freq: Two times a day (BID) | ORAL | 11 refills | 90.00000 days | Status: DC
Start: 2019-05-03 — End: 2019-12-21

## 2019-05-03 MED ORDER — LAMOTRIGINE 100 MG PO TAB
100 mg | ORAL_TABLET | Freq: Two times a day (BID) | ORAL | 11 refills | Status: DC
Start: 2019-05-03 — End: 2019-12-21

## 2019-05-03 MED ORDER — GLYCOPYRROLATE 1 MG PO TAB
1 mg | ORAL_TABLET | Freq: Three times a day (TID) | ORAL | 11 refills | 90.00000 days | Status: DC
Start: 2019-05-03 — End: 2019-12-21

## 2019-05-03 MED ORDER — CLORAZEPATE DIPOTASSIUM 3.75 MG PO TAB
3.75 mg | ORAL_TABLET | Freq: Three times a day (TID) | ORAL | 5 refills | 6.00000 days | Status: DC
Start: 2019-05-03 — End: 2019-11-28

## 2019-05-03 NOTE — Progress Notes
Telehealth Visit Note    Date of Service: 05/03/2019    Subjective:      Obtained patient's verbal consent to treat them and their agreement to Marcus Gibbs financial policy and NPP via this telehealth visit during the [facility]       Marcus Gibbs is a 43 y.o. male.  has a past medical history of Birth defect, Chronic constipation, Convulsion (HCC), CP (cerebral palsy) (HCC), Down syndrome, Gastroparesis, Gastrostomy in place Marcus Gibbs), Hearing problem, Hypothyroidism, Osteoporosis, Seizures (HCC), and Stomach problems.     History of Present Illness  Mother reports Marcus Gibbs currently has a sinus infection. He did take a COVID test and it was negative.    She reports Marcus Gibbs is still taking the 2000mg  keppra BID and 100mg  lamictal BID without seizure. She denies seizure activity. She notes she has difficulty telling the difference between agitation episodes and seizures.     Mother reports that Marcus Gibbs is still taking the tranxene. She reports that the reduction in dose led to an increase in agitation and symptoms.     She reports his agitation increases at loud music, changes with diapers, and eating.     He has had fever every night with sore throat.        Review of Systems   Constitutional: Positive for fever.   HENT: Positive for congestion, drooling, sinus pressure, sinus pain, sore throat and trouble swallowing.    Eyes: Positive for discharge and redness.   Gastrointestinal: Positive for constipation.   Genitourinary: Positive for frequency.   Musculoskeletal: Positive for arthralgias, gait problem and myalgias.   Neurological: Positive for tremors and weakness.   Psychiatric/Behavioral: Positive for sleep disturbance.   All other systems reviewed and are negative.        Objective:         ??? bisacodyl (DULCOLAX) 10 mg rectal suppository Insert or Apply 10 mg to rectal area as directed daily as needed.   ??? Calcium Carbonate (TUMS EXTRA STRENGTH SMOOTHIES) 300 mg (750 mg) PO Chew Take 1 Tab via feeding tube daily. Crushed/feeding tube   ??? chlorhexidine gluconate (PERIDEX) 0.12 % solution Take 15 mL by mouth twice daily.   ??? cholecalciferol (VITAMIN D-3) 1,000 units tablet by PEG Tube route twice daily. Patient is taking (2) tablespoons twice a week   ??? clorazepate (TRANXENE) 3.75 mg tablet Take one tablet by mouth three times daily. 7.5mg  am; 3.75mg  pm   ??? denosumab (PROLIA) 60 mg/mL syrg Inject 60 mg into area(s) as directed once. Indications: every 6 months   ??? fluticasone (FLONASE) 50 mcg/actuation nasal spray Apply 2 Sprays to each nostril as directed daily.   ??? glycopyrrolate (ROBINUL) 1 mg tablet Take one tablet by mouth three times daily.   ??? LACTOSE-REDUCED FOOD (ENSURE PO) Take  by mouth. Patient is using (2) cans of this a day   ??? lamoTRIgine (LAMICTAL) 100 mg tablet Take one tablet by mouth twice daily. Take 1 tab twice daily via feeding tube   ??? levETIRAcetam (KEPPRA) 100 mg/mL oral solution Give 20ml Via feeding gastrostomy twice daily   ??? levothyroxine (SYNTHROID) 50 mcg tablet Take one tablet by mouth daily 30 minutes before breakfast.   ??? melatonin 5 mg/15 mL liqd 5 mg by PEG Tube route at bedtime daily.   ??? Menthol-Zinc Oxide (CALMOSEPTINE) 0.44-20.6 % oint Apply  to affected area daily.   ??? Nutritional Supplements (CARNATION INSTANT BREAKFAST) PO Liqd Take  by mouth. 1/2 PACKAGE OF INSTANT  BREAKFAST AT 9AM IN ADDITION  TO BREAKFAST, LUNCH AND SNACKS;1/2 PACKAGE OF INSTANT BREAKFAST AT 5PM, INSTANT BREAKFAST IS MIXED WITH PEDIASURE.IF Tarry IS STILL HUNGRY AFTER 5PM HE RECEIVES MORE. Marcus Gibbs RECEIVES ALL MEDICATIONS (CRUSHED WHEN REQUIRED) IN FEEDING TUBE      Vitals:    05/03/19 1344   Weight: 35.8 kg (79 lb)   Height: 139.7 cm (55)   PainSc: Zero     Body mass index is 18.36 kg/m???.     Telehealth Patient Reported Vitals     Row Name 05/03/19 1344                Pain Score  Zero              Physical Exam  Patient is non verbal and currently sleeping. Spoke with patient's mother.        Assessment and Plan:  Marcus Gibbs is a 43 year old male with Down syndrome, CP, and epilepsy since infancy who presents for a six-month follow-up regarding epilepsy and episodes of agitation described previously and above. ???Patient has had no clinical seizures and a period of over a year.  ???  Current AEDs:  2 g Keppra twice daily   100 mg Lamictal twice daily  ???  Patient had some agitation episodes which continue that family was concerned could be seizures. EEG performed after last visit 12/22/2017 showing encephalopathy. We started tranxene to see if this would help the episodes.    We have previously tried 3.75mg  at night; then 3.75mg  in the morning; then BID; then 7.5mg  in the morning and 3.75mg  at night. We have tried weaning down at the last visit in Feb 2020 with noted increased agitation. Therefore, prior to increasing dose, we will try TID dosing.       PLAN  > Continue Current AEDs:  2 g Keppra twice daily   100 mg Lamictal twice daily  > Tranxene 3.75mg  TID (Called in to pharmacy)    Orders Placed This Encounter   ??? clorazepate (TRANXENE) 3.75 mg tablet   ??? glycopyrrolate (ROBINUL) 1 mg tablet   ??? lamoTRIgine (LAMICTAL) 100 mg tablet   ??? levETIRAcetam (KEPPRA) 100 mg/mL oral solution           Patient seen and examined with Dr. Harlow Mares, MD [facility]  Neurology Resident, PGY-4    Return to Clinic in next available in about 6 months                               15 minutes spent on this patient's encounter with counseling and coordination of care taking >50% of the visit.

## 2019-05-03 NOTE — Progress Notes
I have personally interviewed the mother of Marcus Gibbs and reviewed the history, impression, and plan of care as outlined by Dr.Michael Park Breed, MD. I personally  participated in counseling and coordination of care.  I agree with the assessment and plan as documented by Dr. Lennox Solders, MD.

## 2019-05-30 ENCOUNTER — Encounter: Admit: 2019-05-30 | Discharge: 2019-05-30 | Payer: MEDICARE

## 2019-05-30 MED ORDER — MELATONIN 5 MG/15 ML PO LIQD
Freq: Every evening | ORAL | 11 refills | 28.00000 days | Status: DC
Start: 2019-05-30 — End: 2019-07-26

## 2019-07-10 ENCOUNTER — Encounter: Admit: 2019-07-10 | Discharge: 2019-07-10 | Payer: MEDICARE

## 2019-07-10 DIAGNOSIS — N390 Urinary tract infection, site not specified: Secondary | ICD-10-CM

## 2019-07-10 DIAGNOSIS — R569 Unspecified convulsions: Secondary | ICD-10-CM

## 2019-07-10 DIAGNOSIS — E039 Hypothyroidism, unspecified: Secondary | ICD-10-CM

## 2019-07-10 DIAGNOSIS — K5909 Other constipation: Secondary | ICD-10-CM

## 2019-07-10 DIAGNOSIS — K3184 Gastroparesis: Secondary | ICD-10-CM

## 2019-07-10 DIAGNOSIS — Z931 Gastrostomy status: Secondary | ICD-10-CM

## 2019-07-10 DIAGNOSIS — Q899 Congenital malformation, unspecified: Secondary | ICD-10-CM

## 2019-07-10 DIAGNOSIS — Z1159 Encounter for screening for other viral diseases: Secondary | ICD-10-CM

## 2019-07-10 DIAGNOSIS — Q909 Down syndrome, unspecified: Secondary | ICD-10-CM

## 2019-07-10 DIAGNOSIS — K319 Disease of stomach and duodenum, unspecified: Secondary | ICD-10-CM

## 2019-07-10 DIAGNOSIS — G809 Cerebral palsy, unspecified: Secondary | ICD-10-CM

## 2019-07-10 DIAGNOSIS — H9190 Unspecified hearing loss, unspecified ear: Secondary | ICD-10-CM

## 2019-07-10 DIAGNOSIS — Z87448 Personal history of other diseases of urinary system: Secondary | ICD-10-CM

## 2019-07-10 DIAGNOSIS — M810 Age-related osteoporosis without current pathological fracture: Secondary | ICD-10-CM

## 2019-07-10 NOTE — Telephone Encounter
[facility] for pre-op COVID screen as pt lives in [place] and mother doesn't want to transfer here for swab. They agreed to call pt's mother to schedule. They stated they did not need order to be faxed that they would take it from here.  Let them know the surgery date was 08/08/2019.

## 2019-07-11 ENCOUNTER — Ambulatory Visit: Admit: 2019-07-11 | Discharge: 2019-07-11 | Payer: MEDICARE

## 2019-07-11 DIAGNOSIS — Z87448 Personal history of other diseases of urinary system: Secondary | ICD-10-CM

## 2019-07-11 DIAGNOSIS — N390 Urinary tract infection, site not specified: Secondary | ICD-10-CM

## 2019-07-13 ENCOUNTER — Encounter: Admit: 2019-07-13 | Discharge: 2019-07-13 | Payer: MEDICARE

## 2019-07-13 DIAGNOSIS — Q909 Down syndrome, unspecified: Secondary | ICD-10-CM

## 2019-07-13 DIAGNOSIS — E039 Hypothyroidism, unspecified: Secondary | ICD-10-CM

## 2019-07-13 DIAGNOSIS — G809 Cerebral palsy, unspecified: Secondary | ICD-10-CM

## 2019-07-13 DIAGNOSIS — R569 Unspecified convulsions: Secondary | ICD-10-CM

## 2019-07-13 DIAGNOSIS — Z931 Gastrostomy status: Secondary | ICD-10-CM

## 2019-07-13 DIAGNOSIS — K5909 Other constipation: Secondary | ICD-10-CM

## 2019-07-13 DIAGNOSIS — M810 Age-related osteoporosis without current pathological fracture: Secondary | ICD-10-CM

## 2019-07-13 DIAGNOSIS — K3184 Gastroparesis: Secondary | ICD-10-CM

## 2019-07-13 DIAGNOSIS — H9190 Unspecified hearing loss, unspecified ear: Secondary | ICD-10-CM

## 2019-07-13 DIAGNOSIS — Q899 Congenital malformation, unspecified: Secondary | ICD-10-CM

## 2019-07-13 DIAGNOSIS — K319 Disease of stomach and duodenum, unspecified: Secondary | ICD-10-CM

## 2019-07-24 ENCOUNTER — Encounter: Admit: 2019-07-24 | Discharge: 2019-07-24 | Payer: MEDICARE

## 2019-07-24 DIAGNOSIS — M810 Age-related osteoporosis without current pathological fracture: Secondary | ICD-10-CM

## 2019-07-24 DIAGNOSIS — Z931 Gastrostomy status: Secondary | ICD-10-CM

## 2019-07-24 DIAGNOSIS — Q909 Down syndrome, unspecified: Secondary | ICD-10-CM

## 2019-07-24 DIAGNOSIS — Q899 Congenital malformation, unspecified: Secondary | ICD-10-CM

## 2019-07-24 DIAGNOSIS — R569 Unspecified convulsions: Secondary | ICD-10-CM

## 2019-07-24 DIAGNOSIS — K3184 Gastroparesis: Secondary | ICD-10-CM

## 2019-07-24 DIAGNOSIS — G809 Cerebral palsy, unspecified: Secondary | ICD-10-CM

## 2019-07-24 DIAGNOSIS — K319 Disease of stomach and duodenum, unspecified: Secondary | ICD-10-CM

## 2019-07-24 DIAGNOSIS — E039 Hypothyroidism, unspecified: Secondary | ICD-10-CM

## 2019-07-24 DIAGNOSIS — K5909 Other constipation: Secondary | ICD-10-CM

## 2019-07-24 DIAGNOSIS — H9190 Unspecified hearing loss, unspecified ear: Secondary | ICD-10-CM

## 2019-07-26 ENCOUNTER — Ambulatory Visit: Admit: 2019-07-26 | Discharge: 2019-07-27 | Payer: MEDICARE

## 2019-07-26 ENCOUNTER — Encounter: Admit: 2019-07-26 | Discharge: 2019-07-26 | Payer: MEDICARE

## 2019-07-26 DIAGNOSIS — M810 Age-related osteoporosis without current pathological fracture: Secondary | ICD-10-CM

## 2019-07-26 DIAGNOSIS — Z931 Gastrostomy status: Secondary | ICD-10-CM

## 2019-07-26 DIAGNOSIS — Q899 Congenital malformation, unspecified: Secondary | ICD-10-CM

## 2019-07-26 DIAGNOSIS — K319 Disease of stomach and duodenum, unspecified: Secondary | ICD-10-CM

## 2019-07-26 DIAGNOSIS — Q909 Down syndrome, unspecified: Secondary | ICD-10-CM

## 2019-07-26 DIAGNOSIS — N390 Urinary tract infection, site not specified: Secondary | ICD-10-CM

## 2019-07-26 DIAGNOSIS — G809 Cerebral palsy, unspecified: Secondary | ICD-10-CM

## 2019-07-26 DIAGNOSIS — H9190 Unspecified hearing loss, unspecified ear: Secondary | ICD-10-CM

## 2019-07-26 DIAGNOSIS — K3184 Gastroparesis: Secondary | ICD-10-CM

## 2019-07-26 DIAGNOSIS — R001 Bradycardia, unspecified: Secondary | ICD-10-CM

## 2019-07-26 DIAGNOSIS — K5909 Other constipation: Secondary | ICD-10-CM

## 2019-07-26 DIAGNOSIS — E039 Hypothyroidism, unspecified: Secondary | ICD-10-CM

## 2019-07-26 DIAGNOSIS — R569 Unspecified convulsions: Secondary | ICD-10-CM

## 2019-07-26 NOTE — Pre-Anesthesia Patient Instructions
GENERAL INFORMATION    Before you come to the hospital  ? Make arrangements for a responsible adult to drive you home and stay with you for 24 hours following surgery.  ? Bath/Shower Instructions  ? Take a bath or shower with antibacterial soap the night before or the morning of your procedure. Use clean towels.  ? Put on clean clothes after bath or shower.  Avoid using lotion and oils.  ? If you are having surgery above the waist, wear a shirt that fastens up the front.  ? Sleep on clean sheets if bath or shower is done the night before procedure.  ? Leave money, credit cards, jewelry, and any other valuables at home. [facility] is not responsible for the loss or breakage of personal items.  ? Remove nail polish, makeup and all jewelry (including piercings) before coming to the hospital.  ? The morning of your procedure:  ? brush your teeth and tongue  ? do not smoke  ? do not shave the area where you will have surgery    What to bring to the hospital  ? ID/ Insurance Card  ? Medical Device card  ? Official documents for legal guardianship   ? Copy of your Living Will, Advanced Directives, and/or Durable Power of Attorney   ? Small bag with a few personal belongings  ? CPAP/BiPAP machine (including all supplies)  ? Walker,cane, or motorized scooter  ? Cases for glasses/hearing aids/contact lens (bring solutions for contacts)  ? Dress in clean, loose, comfortable clothing     Eating or drinking before surgery  ? Do not eat or drink anything after 11:00 p.m. the day before your procedure (including gum, mints, candy, or chewing tobacco) OR follow the specific instructions you were given by your Surgeon.  ? You may have WATER ONLY up to 2 hours before arriving at the hospital.       Other instructions  Notify your surgeon if:  ? there is a possibility that you are pregnant  ? you become ill with a cough, fever, sore throat, nausea, vomiting or flu-like symptoms ? you have any open wounds/sores that are red, painful, draining, or are new since you last saw  the doctor  ? you need to cancel your procedure  ? You will receive a call with your surgery arrival time from between 2:30pm and 4:30pm the last business day before your procedure.  If you do not receive a call, please call [phone] before 4:30pm or [phone] after 4:30pm.    Notify us at [facility]: [phone]  ? if you need to cancel your procedure  ? if you are going to be late    Arrival at the hospital    [facility]  [address]  [place], [place] [address]    ? Park in the [employer], located directly across from the main entrance to the hospital.  ? Enter through the ground floor main hospital entrance and check in at the Information Desk in the lobby.  ? They will validate your parking ticket and direct you to the next location.        Coronavirus (COVID19) Information  If you get sick with fever (100.50F/38C or higher), cough, or have trouble breathing:  ? Call your primary care physician for questions or health needs  ? Tell your doctor about any recent travel and your symptoms.  ? Avoid contact with others.  ? Notify your  Careers adviser.  For up to date information on the Coronavirus, visit the CDC website at DiningCalendar.de.        For the safety of all patients, visitors and staff as we work to contain COVID-19, we must dramatically restrict patient visitors.      Current Visitor Policy (02/08/19):  One visitor per patient per day.  Exceptions include:    Two parents/guardian for patients younger than 7.  End-of-life patients may be allowed additional support persons.  Visitors should check with the patient's nurse.  Only cancer patients at their exam visit may have one visitor with them.  No visitors are allowed for cancer patients receiving treatment/infusion services.  This applies at all cancer center locations. Restrictions still apply for patients who test positive for COVID-19 (no visitors).    Visitors will continue to be screened at all entrances.  They must be free of fever and symptoms to be in our facilities.  We ask visitors to follow these guidelines:  Wear a mask at all times.  Go directly to the nursing station in the unit you are visiting and do not linger in public areas.  Check in at the nursing station before going to the patient's room.  Maintain a physical distance of six feet from all others.  Follow elevator restrictions to four riding at a time - peak times are 6:30-7:30 a.m., noon and 6:30-7:30 p.m.  Be aware cafeteria peak times are 11 a.m. - 1 p.m.  Wash your hands frequently and cover your coughs and sneezes.

## 2019-07-26 NOTE — Anesthesia Pre-Procedure Evaluation
Anesthesia Pre-Procedure Evaluation    Name: Marcus Gibbs      MRN: [id number]     DOB: Apr 24, 1976     Age: 43 y.o.     Sex: male   _________________________________________________________________________     Procedure Info:   Procedure Information     Date/Time: 08/08/19 0950    Procedure: CYSTOURETHROSCOPY AND RETROGRADE URETHROGRAM (N/A ) - CASE LENGTH 1 HOUR, NEED C-ARM FOR RETROGRADE URETHROGRAM    Location: MAIN OR 53 / Main OR/Periop    Surgeon: Marylen Ponto, MD          Physical Assessment  Vital Signs (last filed in past 24 hours):         Patient History   Allergies   Allergen Reactions   ? Cephalosporins HIVES        Current Medications    Medication Directions   Calcium Carbonate (TUMS EXTRA STRENGTH SMOOTHIES) 300 mg (750 mg) PO Chew Chew 1 tablet by mouth daily. Puts in cereal   calcium carbonate/vitamin D3 (VITAMIN D-3 PO) Take 3 tablespoons by feeding tube twice weekly (on Tuesday and Friday)    1 serving = 2 Tablespoons  Calcium citrate: 1,000 mg per serving  Magnesium (doesn't indicate amount)  Vitamin D 25 mg per serving   chlorhexidine gluconate (PERIDEX) 0.12 % solution Take 15 mL by mouth twice daily.   clorazepate (TRANXENE) 3.75 mg tablet Take one tablet by mouth three times daily. 7.5mg  am; 3.75mg  pm  Patient taking differently: Take 3.75 mg via feeding tube three times daily.   denosumab (PROLIA) 60 mg/mL syrg Inject subcutaneous every 6 months  Indications: every 6 months   famotidine (PEPCID) 40 mg/5 mL (8 mg/mL) susp oral suspension Take 40 mg by mouth twice daily.   fexofenadine (ALLEGRA) 30 mg/5 mL suspension Take 60 mg by mouth twice daily.   fluticasone (FLONASE) 50 mcg/actuation nasal spray Apply 2 Sprays to each nostril as directed daily.   glycopyrrolate (ROBINUL) 1 mg tablet Take one tablet by mouth three times daily.  Patient taking differently: Take 2 tablets (2 mg) in the morning and 1 tablet (1 mg) at night   LACTOSE-REDUCED FOOD (ENSURE PO) Take 2 Cans by mouth daily. lamoTRIgine (LAMICTAL) 100 mg tablet Take one tablet by mouth twice daily. Take 1 tab twice daily via feeding tube   levETIRAcetam (KEPPRA) 100 mg/mL oral solution Give 20ml Via feeding gastrostomy twice daily   levothyroxine (SYNTHROID) 50 mcg tablet Take one tablet by mouth daily 30 minutes before breakfast.  Patient taking differently: Take 50 mcg via feeding tube daily 30 minutes before breakfast.   nitrofurantoin macrocrystaL (MACRODANTIN) 50 mg capsule Take 50 mg by mouth at bedtime daily. Take with food.   Nutritional Supplements (CARNATION INSTANT BREAKFAST) PO Liqd Puts in ensure product. 1 packet per day.   omeprazole DR (PRILOSEC) 20 mg capsule Take 20 mg by mouth daily as needed.   polyethylene glycol 3350 (MIRALAX) 17 g packet Take 17 g via feeding tube at bedtime daily.         Review of Systems/Medical History      Patient summary reviewed  Nursing notes reviewed  Pertinent labs reviewed    PONV Screening: Non-smoker  No history of anesthetic complications  No family history of anesthetic complications        Pulmonary           Pneumonia (2016 in setting of urosepsis)      No recent URI  Cardiovascular       Recent diagnostic studies:          echocardiogram          Echo 03/03/15 (post pericardial tap)  LVEF=75% With Abnormal Septal Motion  Normal LV Wall Thickness  Normal Chamber Dimension  Valves Are Unremarkable  No Pericardial Effusion      Exercise tolerance: unknown (wheelchair bound)      No hypertension,       No DVT      No hyperlipidemia      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  Followed up 04/2015 with Dr.Willhoite, who recommended no further studies unless symptomatic. No further cardiac follow up has occurred.     Hx PDA      GI/Hepatic/Renal           GERD: on H2B, PPI.      No hx of liver disease     No renal disease PEG tube in place. Can take soft foods by mouth. All liquids, meds, and ensure though PEG.       Neuro/Psych       Seizures (on keppra, lamictal. unkown last siezure- has some involuntary movements that are difficult for his mother to differentiate from partial seizures.)      Neuromuscular disease (Cerebral palsy)      No CVA      Downs syndrome      Musculoskeletal         Scoliosis       Endocrine/Other         Hypothyroidism (replaced)      No autoimmune disease      No malignancy   PHYSICAL EXAM     Diagnostic Tests  Hematology:   Lab Results   Component Value Date    HGB 14.7 05/25/2018    HCT 44.6 05/25/2018    PLTCT 258 05/25/2018    WBC 6.3 05/25/2018    NEUT 57.6 06/23/2015    ANC 2650 06/23/2015    LYMPH 5 02/21/2015    ALC 1435 06/23/2015    MONA 8.5 06/23/2015    AMC 391 06/23/2015    EOSA 1.4 06/23/2015    ABC 60 06/23/2015    BASOPHILS 2 02/21/2015    MCV 101.1 05/25/2018    MCH 33.4 05/25/2018    MCHC 33.1 05/25/2018    MPV 8.3 05/25/2018    RDW 13.6 05/25/2018         General Chemistry:   Lab Results   Component Value Date    NA 139 03/05/2019    K 4.2 03/05/2019    CL 104 03/05/2019    CO2 28 03/05/2019    GAP 11 03/05/2019    BUN 12 03/05/2019    CR 0.83 03/05/2019    GLU 61 03/05/2019    GLU 75 06/23/2017    CA 9.3 03/05/2019    ALBUMIN 4.0 05/25/2018    MG 2.1 03/03/2015    TOTBILI 0.4 05/25/2018    PO4 4.3 06/06/2006      Coagulation:   Lab Results   Component Value Date    PTT 31.9 02/26/2015    INR 1.0 02/26/2015         Anesthesia Plan    ASA score: 3   Plan: general  Induction method: intravenous      Informed Consent  Use of blood  products discussed with legal guardian and mother        Comments: (02/27/15; 1414; Mask ventilation not attempted (0); Direct laryngoscopy, Stylet; Single-Lumen, Cuffed; 6.48mm; Mac; 2; Oral; 1-Full view of the glottis; 1 insertion attempt; Auscultation, ETCO2 Detector; 17 centimeters; 02/27/15)       LAB: CBC, CMP to be drawn at [facility] outpatient lab on 11/30 ROI: none  Consult: nonie      Patient is nonverbal, mother/guardian provided history.

## 2019-08-07 ENCOUNTER — Encounter: Admit: 2019-08-07 | Discharge: 2019-08-07 | Payer: MEDICARE

## 2019-08-07 NOTE — Telephone Encounter
I called the patients mother on Monday November 30 and left a message at 9:40am asking for a call back in regards to the surgery that was scheduled for [place] on 08/08/2019. I never received a call back so I attempted to contact her again on Tuesday December 1 @ 11:01am but had to leave another message.    I did ask for a call back to confirm that the message was received and I did make it clear in the message that the case unfortunately has to be postponed.     The patients case has been postponed due to the increase in COVID cases and the limited availability in the OR.

## 2019-08-07 NOTE — Telephone Encounter
I talked to this pt's mother Marcus Gibbs on the phone and let her know that the surgery has been postponed. Marcus Gibbs thanked me for calling her back and stated that they live in [place] and "would have been upset if they drove that far just to turn around". Nothing else was needed.              I attached this as a reference.  Conversation: Scheduling  (Newest Message First)  Marcus Gibbs        08/07/19 1:38 PM  Note     I called the patients mother on Monday November 30 and left a message at 9:40am asking for a call back in regards to the surgery that was scheduled for [place] on 08/08/2019. I never received a call back so I attempted to contact her again on Tuesday December 1 @ 11:01am but had to leave another message.    I did ask for a call back to confirm that the message was received and I did make it clear in the message that the case unfortunately has to be postponed.     The patients case has been postponed due to the increase in COVID cases and the limited availability in the OR.

## 2019-09-25 ENCOUNTER — Encounter: Admit: 2019-09-25 | Discharge: 2019-09-25 | Payer: MEDICARE

## 2019-10-04 ENCOUNTER — Encounter: Admit: 2019-10-04 | Discharge: 2019-10-04 | Payer: MEDICARE

## 2019-10-09 ENCOUNTER — Encounter: Admit: 2019-10-09 | Discharge: 2019-10-09 | Payer: MEDICARE

## 2019-10-09 ENCOUNTER — Ambulatory Visit: Admit: 2019-10-09 | Discharge: 2019-10-10 | Payer: MEDICARE

## 2019-10-09 DIAGNOSIS — R569 Unspecified convulsions: Secondary | ICD-10-CM

## 2019-10-09 DIAGNOSIS — Q899 Congenital malformation, unspecified: Secondary | ICD-10-CM

## 2019-10-09 DIAGNOSIS — M810 Age-related osteoporosis without current pathological fracture: Secondary | ICD-10-CM

## 2019-10-09 DIAGNOSIS — Q909 Down syndrome, unspecified: Secondary | ICD-10-CM

## 2019-10-09 DIAGNOSIS — E039 Hypothyroidism, unspecified: Secondary | ICD-10-CM

## 2019-10-09 DIAGNOSIS — K3184 Gastroparesis: Secondary | ICD-10-CM

## 2019-10-09 DIAGNOSIS — R001 Bradycardia, unspecified: Secondary | ICD-10-CM

## 2019-10-09 DIAGNOSIS — Z931 Gastrostomy status: Secondary | ICD-10-CM

## 2019-10-09 DIAGNOSIS — K319 Disease of stomach and duodenum, unspecified: Secondary | ICD-10-CM

## 2019-10-09 DIAGNOSIS — H9190 Unspecified hearing loss, unspecified ear: Secondary | ICD-10-CM

## 2019-10-09 DIAGNOSIS — K5909 Other constipation: Secondary | ICD-10-CM

## 2019-10-09 DIAGNOSIS — G809 Cerebral palsy, unspecified: Secondary | ICD-10-CM

## 2019-10-09 MED ORDER — DENOSUMAB 60 MG/ML SC SYRG
60 mg | Freq: Once | SUBCUTANEOUS | 0 refills | Status: CN
Start: 2019-10-09 — End: ?

## 2019-10-09 MED ORDER — DENOSUMAB 60 MG/ML SC SYRG
60 mg | Freq: Once | SUBCUTANEOUS | 0 refills | Status: CP
Start: 2019-10-09 — End: ?
  Administered 2019-10-09: 19:00:00 60 mg via SUBCUTANEOUS

## 2019-10-09 NOTE — Progress Notes
Prolia given left upper arm without complication this visit to infusion clinic

## 2019-10-29 ENCOUNTER — Ambulatory Visit: Admit: 2019-10-29 | Discharge: 2019-10-29 | Payer: MEDICARE

## 2019-10-29 ENCOUNTER — Encounter: Admit: 2019-10-29 | Discharge: 2019-10-29 | Payer: MEDICARE

## 2019-10-29 DIAGNOSIS — K5909 Other constipation: Secondary | ICD-10-CM

## 2019-10-29 DIAGNOSIS — R569 Unspecified convulsions: Secondary | ICD-10-CM

## 2019-10-29 DIAGNOSIS — Q899 Congenital malformation, unspecified: Secondary | ICD-10-CM

## 2019-10-29 DIAGNOSIS — Q909 Down syndrome, unspecified: Secondary | ICD-10-CM

## 2019-10-29 DIAGNOSIS — R001 Bradycardia, unspecified: Secondary | ICD-10-CM

## 2019-10-29 DIAGNOSIS — E039 Hypothyroidism, unspecified: Secondary | ICD-10-CM

## 2019-10-29 DIAGNOSIS — K3184 Gastroparesis: Secondary | ICD-10-CM

## 2019-10-29 DIAGNOSIS — M810 Age-related osteoporosis without current pathological fracture: Secondary | ICD-10-CM

## 2019-10-29 DIAGNOSIS — K319 Disease of stomach and duodenum, unspecified: Secondary | ICD-10-CM

## 2019-10-29 DIAGNOSIS — G809 Cerebral palsy, unspecified: Secondary | ICD-10-CM

## 2019-10-29 DIAGNOSIS — H9190 Unspecified hearing loss, unspecified ear: Secondary | ICD-10-CM

## 2019-10-29 DIAGNOSIS — Z931 Gastrostomy status: Secondary | ICD-10-CM

## 2019-10-29 MED ORDER — FENTANYL CITRATE (PF) 50 MCG/ML IJ SOLN
0 refills | Status: DC
Start: 2019-10-29 — End: 2019-10-29
  Administered 2019-10-29: 16:00:00 50 ug via INTRAVENOUS

## 2019-10-29 MED ORDER — EPHEDRINE SULFATE 50 MG/ML IV SOLN
0 refills | Status: DC
Start: 2019-10-29 — End: 2019-10-29
  Administered 2019-10-29: 17:00:00 5 mg via INTRAVENOUS
  Administered 2019-10-29: 17:00:00 10 mg via INTRAVENOUS
  Administered 2019-10-29: 16:00:00 5 mg via INTRAVENOUS

## 2019-10-29 MED ORDER — LIDOCAINE (PF) 200 MG/10 ML (2 %) IJ SYRG
0 refills | Status: DC
Start: 2019-10-29 — End: 2019-10-29
  Administered 2019-10-29: 16:00:00 20 mg via INTRAVENOUS

## 2019-10-29 MED ORDER — LEVOFLOXACIN IN D5W 250 MG/50 ML IV PGBK
250 mg | Freq: Once | INTRAVENOUS | 0 refills | Status: CP
Start: 2019-10-29 — End: ?
  Administered 2019-10-29: 17:00:00 250 mg via INTRAVENOUS

## 2019-10-29 MED ORDER — ONDANSETRON HCL (PF) 4 MG/2 ML IJ SOLN
INTRAVENOUS | 0 refills | Status: DC
Start: 2019-10-29 — End: 2019-10-29
  Administered 2019-10-29: 17:00:00 3 mg via INTRAVENOUS

## 2019-10-29 MED ORDER — PROMETHAZINE 25 MG/ML IJ SOLN
6.25 mg | INTRAVENOUS | 0 refills | Status: DC | PRN
Start: 2019-10-29 — End: 2019-10-29

## 2019-10-29 MED ORDER — LIDOCAINE (PF) 10 MG/ML (1 %) IJ SOLN
.1-2 mL | INTRAMUSCULAR | 0 refills | Status: DC | PRN
Start: 2019-10-29 — End: 2019-10-29

## 2019-10-29 MED ORDER — FENTANYL CITRATE (PF) 50 MCG/ML IJ SOLN
12.5 ug | INTRAVENOUS | 0 refills | Status: DC | PRN
Start: 2019-10-29 — End: 2019-10-29

## 2019-10-29 MED ORDER — DIPHENHYDRAMINE HCL 50 MG/ML IJ SOLN
12.5 mg | Freq: Once | INTRAVENOUS | 0 refills | Status: DC | PRN
Start: 2019-10-29 — End: 2019-10-29

## 2019-10-29 MED ORDER — LACTATED RINGERS IV SOLP
1000 mL | INTRAVENOUS | 0 refills | Status: DC
Start: 2019-10-29 — End: 2019-10-29

## 2019-10-29 MED ORDER — CIPROFLOXACIN HCL 500 MG PO TAB
500 mg | ORAL_TABLET | Freq: Two times a day (BID) | ORAL | 0 refills | 10.00000 days | Status: AC
Start: 2019-10-29 — End: ?

## 2019-10-29 MED ORDER — DEXAMETHASONE SODIUM PHOSPHATE 4 MG/ML IJ SOLN
INTRAVENOUS | 0 refills | Status: DC
Start: 2019-10-29 — End: 2019-10-29
  Administered 2019-10-29: 16:00:00 3 mg via INTRAVENOUS

## 2019-10-29 MED ORDER — PROPOFOL INJ 10 MG/ML IV VIAL
0 refills | Status: DC
Start: 2019-10-29 — End: 2019-10-29
  Administered 2019-10-29: 16:00:00 75 mg via INTRAVENOUS

## 2019-10-29 MED ORDER — DEXTRAN 70-HYPROMELLOSE (PF) 0.1-0.3 % OP DPET
0 refills | Status: DC
Start: 2019-10-29 — End: 2019-10-29
  Administered 2019-10-29: 16:00:00 2 [drp] via OPHTHALMIC

## 2019-10-29 MED ADMIN — LACTATED RINGERS IV SOLP [4318]: 1000 mL | INTRAVENOUS | @ 14:00:00 | Stop: 2019-10-29 | NDC 00338011704

## 2019-10-31 ENCOUNTER — Encounter: Admit: 2019-10-31 | Discharge: 2019-10-31 | Payer: MEDICARE

## 2019-10-31 DIAGNOSIS — H9190 Unspecified hearing loss, unspecified ear: Secondary | ICD-10-CM

## 2019-10-31 DIAGNOSIS — R001 Bradycardia, unspecified: Secondary | ICD-10-CM

## 2019-10-31 DIAGNOSIS — Q909 Down syndrome, unspecified: Secondary | ICD-10-CM

## 2019-10-31 DIAGNOSIS — K319 Disease of stomach and duodenum, unspecified: Secondary | ICD-10-CM

## 2019-10-31 DIAGNOSIS — M810 Age-related osteoporosis without current pathological fracture: Secondary | ICD-10-CM

## 2019-10-31 DIAGNOSIS — E039 Hypothyroidism, unspecified: Secondary | ICD-10-CM

## 2019-10-31 DIAGNOSIS — G809 Cerebral palsy, unspecified: Secondary | ICD-10-CM

## 2019-10-31 DIAGNOSIS — R569 Unspecified convulsions: Secondary | ICD-10-CM

## 2019-10-31 DIAGNOSIS — Q899 Congenital malformation, unspecified: Secondary | ICD-10-CM

## 2019-10-31 DIAGNOSIS — Z931 Gastrostomy status: Secondary | ICD-10-CM

## 2019-10-31 DIAGNOSIS — K5909 Other constipation: Secondary | ICD-10-CM

## 2019-10-31 DIAGNOSIS — K3184 Gastroparesis: Secondary | ICD-10-CM

## 2019-11-27 ENCOUNTER — Encounter: Admit: 2019-11-27 | Discharge: 2019-11-27 | Payer: MEDICARE

## 2019-11-28 MED ORDER — CLORAZEPATE DIPOTASSIUM 3.75 MG PO TAB
3.75 mg | ORAL_TABLET | Freq: Three times a day (TID) | GASTROSTOMY | 3 refills | 6.00000 days | Status: DC
Start: 2019-11-28 — End: 2019-12-21

## 2019-12-21 ENCOUNTER — Encounter: Admit: 2019-12-21 | Discharge: 2019-12-21 | Payer: MEDICARE

## 2019-12-21 ENCOUNTER — Ambulatory Visit: Admit: 2019-12-21 | Discharge: 2019-12-22 | Payer: MEDICARE

## 2019-12-21 DIAGNOSIS — Q899 Congenital malformation, unspecified: Secondary | ICD-10-CM

## 2019-12-21 DIAGNOSIS — G40319 Generalized idiopathic epilepsy and epileptic syndromes, intractable, without status epilepticus: Secondary | ICD-10-CM

## 2019-12-21 DIAGNOSIS — G809 Cerebral palsy, unspecified: Secondary | ICD-10-CM

## 2019-12-21 DIAGNOSIS — Z931 Gastrostomy status: Secondary | ICD-10-CM

## 2019-12-21 DIAGNOSIS — M810 Age-related osteoporosis without current pathological fracture: Secondary | ICD-10-CM

## 2019-12-21 DIAGNOSIS — R569 Unspecified convulsions: Secondary | ICD-10-CM

## 2019-12-21 DIAGNOSIS — K319 Disease of stomach and duodenum, unspecified: Secondary | ICD-10-CM

## 2019-12-21 DIAGNOSIS — E039 Hypothyroidism, unspecified: Secondary | ICD-10-CM

## 2019-12-21 DIAGNOSIS — R001 Bradycardia, unspecified: Secondary | ICD-10-CM

## 2019-12-21 DIAGNOSIS — K5909 Other constipation: Secondary | ICD-10-CM

## 2019-12-21 DIAGNOSIS — Q909 Down syndrome, unspecified: Secondary | ICD-10-CM

## 2019-12-21 DIAGNOSIS — H9190 Unspecified hearing loss, unspecified ear: Secondary | ICD-10-CM

## 2019-12-21 DIAGNOSIS — K3184 Gastroparesis: Secondary | ICD-10-CM

## 2019-12-21 MED ORDER — LEVETIRACETAM 100 MG/ML PO SOLN
Freq: Two times a day (BID) | ORAL | 11 refills | 90.00000 days | Status: AC
Start: 2019-12-21 — End: ?

## 2019-12-21 MED ORDER — GLYCOPYRROLATE 1 MG PO TAB
1 mg | ORAL_TABLET | Freq: Three times a day (TID) | ORAL | 11 refills | 90.00000 days | Status: AC
Start: 2019-12-21 — End: ?

## 2019-12-21 MED ORDER — LAMOTRIGINE 100 MG PO TAB
100 mg | ORAL_TABLET | Freq: Two times a day (BID) | ORAL | 11 refills | Status: AC
Start: 2019-12-21 — End: ?

## 2019-12-21 MED ORDER — CLORAZEPATE DIPOTASSIUM 3.75 MG PO TAB
3.75 mg | ORAL_TABLET | Freq: Three times a day (TID) | GASTROSTOMY | 3 refills | 6.00000 days | Status: DC
Start: 2019-12-21 — End: 2020-03-26

## 2019-12-21 NOTE — Progress Notes
Telehealth Visit Note    Date of Service: 12/21/2019    Subjective:      Obtained patient's verbal consent to treat them and their agreement to [facility] financial policy and NPP via this telehealth visit during the [facility] Emergency       Marcus Gibbs is a 44 y.o. male.    History of Present Illness    Spoke with mother and she reports the family. A lot of difficulty with health difficulties with patient's father. He was currently in Long term care due to multiple surgeries and infections.     Mother notes that Marcus Gibbs leans to the left side and sleeps on that side. She states that he has always done this and she is just curious about it. She denies weakness.     Mother has noted tremors in legs and feet with some jerking movements. These seem to occur with changing diapers, medication admin, laying down, eating.     She notes they have tried to take away one dose of the tranxene (from TID to BID) however this lead to increased agitation so they went back to TID dosing. He was more aggressive off this dosing and is less aggressive on TID.     Marcus Gibbs is still taking keppra and lamictal BID.       Marcus Gibbs has had several UTIs he is being followed and on nitrofurantoin. 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 He has had a stretching procedure which has reduced UTIs.     Mother notes that her husband Marcus Gibbs) has been alone for a while and recovery will be slow. He had bacteremia, broken bones, kidney stones, etc.        Review of Systems   HENT: Positive for dental problem, drooling, sinus pressure and sinus pain.    Endocrine: Positive for cold intolerance.   All other systems reviewed and are negative.        Objective:         ? Calcium Carbonate (TUMS EXTRA STRENGTH SMOOTHIES) 300 mg (750 mg) PO Chew Chew 1 tablet by mouth daily. Puts in cereal   ? calcium carbonate/vitamin D3 (VITAMIN D-3 PO) Take 3 tablespoons by feeding tube twice weekly (on Tuesday and Friday)    1 serving = 2 Tablespoons  Calcium citrate: 1,000 mg per serving  Magnesium (doesn't indicate amount)  Vitamin D 25 mg per serving   ? chlorhexidine gluconate (PERIDEX) 0.12 % solution Take 15 mL by mouth twice daily.   ? clorazepate (TRANXENE) 3.75 mg tablet Take one tablet via feeding tube three times daily.   ? denosumab (PROLIA) 60 mg/mL syrg Inject subcutaneous every 6 months  Indications: every 6 months   ? famotidine (PEPCID) 40 mg/5 mL (8 mg/mL) susp oral suspension Take 40 mg by mouth twice daily.   ? fexofenadine (ALLEGRA) 30 mg/5 mL suspension Take 60 mg by mouth twice daily.   ? fluticasone (FLONASE) 50 mcg/actuation nasal spray Apply 2 Sprays to each nostril as directed daily.   ? glycopyrrolate (ROBINUL) 1 mg tablet Take one tablet by mouth three times daily. (Patient taking differently: Take 2 tablets (2 mg) in the morning and 1 tablet (1 mg) at night)   ? LACTOSE-REDUCED FOOD (ENSURE PO) Take 2 Cans by mouth daily.   ? lamoTRIgine (LAMICTAL) 100 mg tablet Take one tablet by mouth twice daily. Take 1 tab twice daily via feeding tube   ?  levETIRAcetam (KEPPRA) 100 mg/mL oral solution Give 20ml Via feeding gastrostomy twice daily   ? levothyroxine (SYNTHROID) 50 mcg tablet Take one tablet by mouth daily 30 minutes before breakfast. (Patient taking differently: Take 50 mcg via feeding tube daily 30 minutes before breakfast.)   ? nitrofurantoin macrocrystaL (MACRODANTIN) 50 mg capsule Take 50 mg by mouth at bedtime daily. Take with food.   ? Nutritional Supplements (CARNATION INSTANT BREAKFAST) PO Liqd Puts in ensure product. 1 packet per day.   ? omeprazole DR (PRILOSEC) 20 mg capsule Take 20 mg by mouth daily as needed.   ? polyethylene glycol 3350 (MIRALAX) 17 g packet Take 17 g via feeding tube at bedtime daily.     Vitals:    12/21/19 1437   Weight: 30.8 kg (68 lb)   Height: 142.2 cm (55.98)     Body mass index is 15.25 kg/m?Marland Kitchen         Physical Exam  Deferred due to patient not present for telehealth evaluation. Video visit continued with primary care giver.        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 ?Patient has had no clinical seizures and a period of over two years.  ?  Current AEDs:  2 g Keppra twice daily   100 mg Lamictal twice daily  ?  Patient had some agitation episodes which continue that family was concerned could be seizures.?EEG performed after last visit 12/22/2017 showing encephalopathy. We started tranxene to see if this would help the episodes.  ?  We have previously tried 3.75mg  at night; then 3.75mg  in the morning; then BID; then 7.5mg  in the morning and 3.75mg  at night. Patient's mother tried titrating down to 3.75mg  BID and noted increased agitation and therefore plan to stick to TID dosing. ?  ?  PLAN  > Continue Current AEDs:  2 g Keppra twice daily   100 mg Lamictal twice daily  > Tranxene 3.75mg  TID   ?  Orders Placed This Encounter   ? clorazepate (TRANXENE) 3.75 mg tablet   ? glycopyrrolate (ROBINUL) 1 mg tablet   ? lamoTRIgine (LAMICTAL) 100 mg tablet   ? levETIRAcetam (KEPPRA) 100 mg/mL oral solution     ?  ?  ?  Patient seen and examined with Dr. Genevie Cheshire  ?  ?  Ernestene Kiel, MD [facility]  Neurology Resident, PGY-4  ?  Return to Clinic in next available in about 6 months                         20 minutes spent on this patient's encounter with counseling and coordination of care taking >50% of the visit.

## 2019-12-27 MED ORDER — LEVOTHYROXINE 50 MCG PO TAB
50 ug | ORAL_TABLET | Freq: Every day | ORAL | 0 refills
Start: 2019-12-27 — End: ?

## 2019-12-27 MED ORDER — LEVOTHYROXINE 50 MCG PO TAB
50 ug | ORAL_TABLET | Freq: Every day | ORAL | 0 refills | 30.00000 days | Status: AC
Start: 2019-12-27 — End: ?

## 2020-01-14 ENCOUNTER — Encounter: Admit: 2020-01-14 | Discharge: 2020-01-14 | Payer: MEDICARE

## 2020-01-14 NOTE — Telephone Encounter
PA for Glycopyrrolate 1mg  processed via cover my meds through [facility] Medicare.Medicaid approved through December 6th 2021

## 2020-02-27 ENCOUNTER — Encounter: Admit: 2020-02-27 | Discharge: 2020-02-27 | Payer: MEDICARE

## 2020-02-27 ENCOUNTER — Ambulatory Visit: Admit: 2020-02-27 | Discharge: 2020-02-28 | Payer: MEDICARE

## 2020-02-27 DIAGNOSIS — Z931 Gastrostomy status: Secondary | ICD-10-CM

## 2020-02-27 DIAGNOSIS — Q909 Down syndrome, unspecified: Secondary | ICD-10-CM

## 2020-02-27 DIAGNOSIS — R001 Bradycardia, unspecified: Secondary | ICD-10-CM

## 2020-02-27 DIAGNOSIS — R569 Unspecified convulsions: Secondary | ICD-10-CM

## 2020-02-27 DIAGNOSIS — K5909 Other constipation: Secondary | ICD-10-CM

## 2020-02-27 DIAGNOSIS — K319 Disease of stomach and duodenum, unspecified: Secondary | ICD-10-CM

## 2020-02-27 DIAGNOSIS — M818 Other osteoporosis without current pathological fracture: Secondary | ICD-10-CM

## 2020-02-27 DIAGNOSIS — H9190 Unspecified hearing loss, unspecified ear: Secondary | ICD-10-CM

## 2020-02-27 DIAGNOSIS — Q899 Congenital malformation, unspecified: Secondary | ICD-10-CM

## 2020-02-27 DIAGNOSIS — E039 Hypothyroidism, unspecified: Secondary | ICD-10-CM

## 2020-02-27 DIAGNOSIS — K3184 Gastroparesis: Secondary | ICD-10-CM

## 2020-02-27 DIAGNOSIS — G809 Cerebral palsy, unspecified: Secondary | ICD-10-CM

## 2020-02-27 DIAGNOSIS — M810 Age-related osteoporosis without current pathological fracture: Secondary | ICD-10-CM

## 2020-02-27 MED ORDER — DENOSUMAB 60 MG/ML SC SYRG
60 mg | Freq: Once | SUBCUTANEOUS | 0 refills | Status: CN
Start: 2020-02-27 — End: ?

## 2020-02-27 NOTE — Progress Notes
Obtained patient's verbal consent to treat them and their agreement to [facility] financial policy and NPP via this telehealth visit during the Coronavirus Public Health Emergency

## 2020-02-27 NOTE — Progress Notes
Telehealth Visit Note    Date of Service: 02/27/2020    Subjective:      Obtained patient's verbal consent to treat them and their agreement to [facility] financial policy and NPP via this telehealth visit during the [facility] Emergency       Marcus Gibbs is a 44 y.o. male with history of Down Syndrome, seizure disorder, hypothyroidism and osteoporosis who presented to endocrinology clinic for follow up of his osteoporosis. History is provided by mother, Marcus Gibbs, since patient is nonverbal.     History of Present Illness  Interval History  Marcus Gibbs sometimes acts like it's painful to walk-- sometimes wincing and seems more resistant to getting out of his chair. He is able to weight bear.    Took him to the ER the other night-- he was found to have a UTI and started on ciprofloxacin. He has a follow up visit with primary care tomorrow.   He does not smoke or drink alcohol.   He is taking liquid calcium/vitamin D through his feeding tube at 1200 mg calcium per day with 25 mcg of vitamin D  Falls in the last 6 months: none  Patient's assessment of gait and balance: not ambulating frequently   Pain in legs, hip, pelvis, or groin: possibly-- see description above  Exercise: none  Loose or bothersome teeth: none  Steroid therapy since last visit to clinic: none       Review of Systems  No ROS due to patient being nonverbal     Objective:         ? Calcium Carbonate (TUMS EXTRA STRENGTH SMOOTHIES) 300 mg (750 mg) PO Chew Chew 1 tablet by mouth daily. Puts in cereal   ? calcium carbonate/vitamin D3 (VITAMIN D-3 PO) Take 3 tablespoons by feeding tube twice weekly (on Tuesday and Friday)    1 serving = 2 Tablespoons  Calcium citrate: 1,000 mg per serving  Magnesium (doesn't indicate amount)  Vitamin D 25 mg per serving   ? chlorhexidine gluconate (PERIDEX) 0.12 % solution Take 15 mL by mouth twice daily.   ? clorazepate (TRANXENE) 3.75 mg tablet Take one tablet via feeding tube three times daily.   ? denosumab (PROLIA) 60 mg/mL syrg Inject subcutaneous every 6 months  Indications: every 6 months   ? famotidine (PEPCID) 40 mg/5 mL (8 mg/mL) susp oral suspension Take 40 mg by mouth twice daily.   ? fexofenadine (ALLEGRA) 30 mg/5 mL suspension Take 60 mg by mouth twice daily.   ? fluticasone (FLONASE) 50 mcg/actuation nasal spray Apply 2 Sprays to each nostril as directed daily.   ? glycopyrrolate (ROBINUL) 1 mg tablet Take one tablet by mouth three times daily.   ? LACTOSE-REDUCED FOOD (ENSURE PO) Take 2 Cans by mouth daily.   ? lamoTRIgine (LAMICTAL) 100 mg tablet Take one tablet by mouth twice daily. Take 1 tab twice daily via feeding tube   ? levETIRAcetam (KEPPRA) 100 mg/mL oral solution Give 20ml Via feeding gastrostomy twice daily   ? levothyroxine (SYNTHROID) 50 mcg tablet Take one tablet by mouth daily 30 minutes before breakfast.   ? nitrofurantoin macrocrystaL (MACRODANTIN) 50 mg capsule Take 50 mg by mouth at bedtime daily. Take with food.   ? Nutritional Supplements (CARNATION INSTANT BREAKFAST) PO Liqd Puts in ensure product. 1 packet per day.   ? omeprazole DR (PRILOSEC) 20 mg capsule Take 20 mg by mouth daily as needed.   ? polyethylene glycol 3350 (MIRALAX) 17 g packet Take 17  g via feeding tube at bedtime daily.       Telehealth Patient Reported Vitals     Row Name 02/27/20 1015                Weight:  31.3 kg (69 lb)        Height:  139.7 cm (55)        Pain Score:  Zero              Physical Exam    Labs:    Ref. Range 08/06/2019 12:01   Sodium Latest Ref Range: 137 - 147 MMOL/L 137   Potassium Latest Ref Range: 3.5 - 5.1 MMOL/L 4.0   Chloride Latest Ref Range: 98 - 110 MMOL/L 102   CO2 Latest Ref Range: 21 - 30 MMOL/L 29   Anion Gap Latest Ref Range: 3 - 12  6   Blood Urea Nitrogen Latest Ref Range: 7 - 25 MG/DL 15   Creatinine Latest Ref Range: 0.4 - 1.24 MG/DL 4.16   eGFR Non African American Latest Ref Range: >60 mL/min >60   eGFR African American Latest Ref Range: >60 mL/min >60   Glucose Latest Ref Range: 70 - 100 MG/DL 85   Albumin Latest Ref Range: 3.5 - 5.0 G/DL 3.7   Calcium Latest Ref Range: 8.5 - 10.6 MG/DL 9.2   Total Bilirubin Latest Ref Range: 0.3 - 1.2 MG/DL 0.5   Total Protein Latest Ref Range: 6.0 - 8.0 G/DL 7.7   AST (SGOT) Latest Ref Range: 7 - 40 U/L 26   ALT (SGPT) Latest Ref Range: 7 - 56 U/L 33   Alk Phosphatase Latest Ref Range: 25 - 110 U/L 96          Assessment and Plan:  Osteoporosis   - Treatment history: reclast x 5, prolia x 9 starting in 2016. Missed one dose due to the pandemic. Most recent dose 10/2019.   - Tolerates the prolia well.   - Repeat DEXA   - Tentatively plan to continue with prolia treatment every 6 months     Hypothyroidism   - Repeat TSH     Possible leg pain   - Encouraged to have physical exam with PCP to evaluate.     RTC in 1 year     Marcus Linehan M Kingsley, PA-C                         30 minutes spent on this patient's encounter with counseling and coordination of care taking >50% of the visit.

## 2020-03-25 ENCOUNTER — Encounter: Admit: 2020-03-25 | Discharge: 2020-03-25 | Payer: MEDICARE

## 2020-03-26 MED ORDER — CLORAZEPATE DIPOTASSIUM 3.75 MG PO TAB
ORAL_TABLET | Freq: Three times a day (TID) | ORAL | 3 refills | 4.00000 days | Status: AC
Start: 2020-03-26 — End: ?

## 2020-04-07 ENCOUNTER — Encounter: Admit: 2020-04-07 | Discharge: 2020-04-07 | Payer: MEDICARE

## 2020-04-07 ENCOUNTER — Ambulatory Visit: Admit: 2020-04-07 | Discharge: 2020-04-08 | Payer: MEDICARE

## 2020-04-07 DIAGNOSIS — Z931 Gastrostomy status: Secondary | ICD-10-CM

## 2020-04-07 DIAGNOSIS — G809 Cerebral palsy, unspecified: Secondary | ICD-10-CM

## 2020-04-07 DIAGNOSIS — M810 Age-related osteoporosis without current pathological fracture: Principal | ICD-10-CM

## 2020-04-07 DIAGNOSIS — H9190 Unspecified hearing loss, unspecified ear: Secondary | ICD-10-CM

## 2020-04-07 DIAGNOSIS — R569 Unspecified convulsions: Secondary | ICD-10-CM

## 2020-04-07 DIAGNOSIS — K5909 Other constipation: Secondary | ICD-10-CM

## 2020-04-07 DIAGNOSIS — Q909 Down syndrome, unspecified: Secondary | ICD-10-CM

## 2020-04-07 DIAGNOSIS — K319 Disease of stomach and duodenum, unspecified: Secondary | ICD-10-CM

## 2020-04-07 DIAGNOSIS — K3184 Gastroparesis: Secondary | ICD-10-CM

## 2020-04-07 DIAGNOSIS — R001 Bradycardia, unspecified: Secondary | ICD-10-CM

## 2020-04-07 DIAGNOSIS — Q899 Congenital malformation, unspecified: Secondary | ICD-10-CM

## 2020-04-07 DIAGNOSIS — E039 Hypothyroidism, unspecified: Secondary | ICD-10-CM

## 2020-04-07 MED ORDER — DENOSUMAB 60 MG/ML SC SYRG
60 mg | Freq: Once | SUBCUTANEOUS | 0 refills
Start: 2020-04-07 — End: ?

## 2020-04-07 MED ORDER — DENOSUMAB 60 MG/ML SC SYRG
60 mg | Freq: Once | SUBCUTANEOUS | 0 refills | Status: CP
Start: 2020-04-07 — End: ?
  Administered 2020-04-07: 20:00:00 60 mg via SUBCUTANEOUS

## 2020-04-22 ENCOUNTER — Encounter: Admit: 2020-04-22 | Discharge: 2020-04-22 | Payer: MEDICARE

## 2020-04-22 DIAGNOSIS — E039 Hypothyroidism, unspecified: Secondary | ICD-10-CM

## 2020-04-22 DIAGNOSIS — M810 Age-related osteoporosis without current pathological fracture: Secondary | ICD-10-CM

## 2020-04-22 DIAGNOSIS — K3184 Gastroparesis: Secondary | ICD-10-CM

## 2020-04-22 DIAGNOSIS — Q909 Down syndrome, unspecified: Secondary | ICD-10-CM

## 2020-04-22 DIAGNOSIS — G809 Cerebral palsy, unspecified: Secondary | ICD-10-CM

## 2020-04-22 DIAGNOSIS — K5909 Other constipation: Secondary | ICD-10-CM

## 2020-04-22 DIAGNOSIS — R001 Bradycardia, unspecified: Secondary | ICD-10-CM

## 2020-04-22 DIAGNOSIS — Q899 Congenital malformation, unspecified: Secondary | ICD-10-CM

## 2020-04-22 DIAGNOSIS — K319 Disease of stomach and duodenum, unspecified: Secondary | ICD-10-CM

## 2020-04-22 DIAGNOSIS — R399 Unspecified symptoms and signs involving the genitourinary system: Secondary | ICD-10-CM

## 2020-04-22 DIAGNOSIS — H9190 Unspecified hearing loss, unspecified ear: Secondary | ICD-10-CM

## 2020-04-22 DIAGNOSIS — R569 Unspecified convulsions: Secondary | ICD-10-CM

## 2020-04-22 DIAGNOSIS — Z931 Gastrostomy status: Secondary | ICD-10-CM

## 2020-04-27 ENCOUNTER — Encounter: Admit: 2020-04-27 | Discharge: 2020-04-27 | Payer: MEDICARE

## 2020-04-27 DIAGNOSIS — K5909 Other constipation: Secondary | ICD-10-CM

## 2020-04-27 DIAGNOSIS — Q909 Down syndrome, unspecified: Secondary | ICD-10-CM

## 2020-04-27 DIAGNOSIS — R001 Bradycardia, unspecified: Secondary | ICD-10-CM

## 2020-04-27 DIAGNOSIS — E039 Hypothyroidism, unspecified: Secondary | ICD-10-CM

## 2020-04-27 DIAGNOSIS — Z931 Gastrostomy status: Secondary | ICD-10-CM

## 2020-04-27 DIAGNOSIS — Q899 Congenital malformation, unspecified: Secondary | ICD-10-CM

## 2020-04-27 DIAGNOSIS — K3184 Gastroparesis: Secondary | ICD-10-CM

## 2020-04-27 DIAGNOSIS — G809 Cerebral palsy, unspecified: Secondary | ICD-10-CM

## 2020-04-27 DIAGNOSIS — H9190 Unspecified hearing loss, unspecified ear: Secondary | ICD-10-CM

## 2020-04-27 DIAGNOSIS — R569 Unspecified convulsions: Secondary | ICD-10-CM

## 2020-04-27 DIAGNOSIS — M810 Age-related osteoporosis without current pathological fracture: Secondary | ICD-10-CM

## 2020-04-27 DIAGNOSIS — K319 Disease of stomach and duodenum, unspecified: Secondary | ICD-10-CM

## 2020-05-02 ENCOUNTER — Encounter: Admit: 2020-05-02 | Discharge: 2020-05-02 | Payer: MEDICARE

## 2020-05-02 NOTE — Telephone Encounter
Returned call to [name], regarding a couple of issues that have come up for Marcus Gibbs and his catheter. The catheter did fall out and they were able to have it replaced at local hospital [facility]). [name] is wondering if there are any suggestions to help keep catheter clean, as when patient stools, it can often contaminate catheter. I left a voicemail for [name] that they had done the right thing by having the catheter replaced. I suggested trying a maxi pad inside the brief, to sort of create a dam between the front and back of the brief, and possibly helping keep stool from going so far forward and contaminating catheter. Advised that I would inform Dr Oren Bracket of the question and see if he has any recommendation, as well.

## 2020-05-20 ENCOUNTER — Encounter: Admit: 2020-05-20 | Discharge: 2020-05-20 | Payer: MEDICAID

## 2020-05-20 ENCOUNTER — Ambulatory Visit: Admit: 2020-05-20 | Discharge: 2020-05-20 | Payer: MEDICARE

## 2020-05-20 ENCOUNTER — Encounter: Admit: 2020-05-20 | Discharge: 2020-05-20 | Payer: MEDICARE

## 2020-05-20 DIAGNOSIS — K319 Disease of stomach and duodenum, unspecified: Secondary | ICD-10-CM

## 2020-05-20 DIAGNOSIS — E039 Hypothyroidism, unspecified: Secondary | ICD-10-CM

## 2020-05-20 DIAGNOSIS — N319 Neuromuscular dysfunction of bladder, unspecified: Secondary | ICD-10-CM

## 2020-05-20 DIAGNOSIS — R001 Bradycardia, unspecified: Secondary | ICD-10-CM

## 2020-05-20 DIAGNOSIS — G809 Cerebral palsy, unspecified: Secondary | ICD-10-CM

## 2020-05-20 DIAGNOSIS — Q899 Congenital malformation, unspecified: Secondary | ICD-10-CM

## 2020-05-20 DIAGNOSIS — Q909 Down syndrome, unspecified: Secondary | ICD-10-CM

## 2020-05-20 DIAGNOSIS — R569 Unspecified convulsions: Secondary | ICD-10-CM

## 2020-05-20 DIAGNOSIS — K3184 Gastroparesis: Secondary | ICD-10-CM

## 2020-05-20 DIAGNOSIS — R339 Retention of urine, unspecified: Secondary | ICD-10-CM

## 2020-05-20 DIAGNOSIS — M810 Age-related osteoporosis without current pathological fracture: Secondary | ICD-10-CM

## 2020-05-20 DIAGNOSIS — N35914 Unspecified anterior urethral stricture, male: Secondary | ICD-10-CM

## 2020-05-20 DIAGNOSIS — K5909 Other constipation: Secondary | ICD-10-CM

## 2020-05-20 DIAGNOSIS — H9190 Unspecified hearing loss, unspecified ear: Secondary | ICD-10-CM

## 2020-05-20 DIAGNOSIS — Z931 Gastrostomy status: Secondary | ICD-10-CM

## 2020-05-20 LAB — BASIC METABOLIC PANEL
Lab: 1.1 mg/dL (ref 0.4–1.24)
Lab: 100 MMOL/L (ref 98–110)
Lab: 136 MMOL/L — ABNORMAL LOW (ref 137–147)
Lab: 27 MMOL/L (ref 21–30)
Lab: 4.3 MMOL/L (ref 3.5–5.1)
Lab: 88 mg/dL (ref 70–100)
Lab: 9 K/UL (ref 3–12)
Lab: 9.9 mg/dL (ref 8.5–10.6)
Lab: >60 mL/min (ref >60)
Lab: >60 mL/min (ref >60)

## 2020-05-20 NOTE — Progress Notes
Pt here for foley cath change. Removed catheter tubing after 10 ml clear fluid withdrawn from balloon.  Inserted #14 fr cath using sterile technique.  Return of very small amt clear yellow urine in tubing. Cath to diaper and mother manages incontinence. Will arrange home health with [facility] for next monthly foley change.

## 2020-05-21 ENCOUNTER — Encounter: Admit: 2020-05-21 | Discharge: 2020-05-21 | Payer: MEDICAID

## 2020-05-21 NOTE — Progress Notes
Plan: Home Health     Interaction: SW was notified by [facility] that pt is needing [facility] RN services arranged. Pts mother would like HH arranged through [facility] health. SW contacted [place] home health [phone] to obtain fax number: fax [phone]. SW faxed [facility] RN orders and supporting documents to [facility]. Home Health to contact pt's mother [name] to arrange start of care.     Harmon Dun, LMSW

## 2020-05-22 ENCOUNTER — Encounter: Admit: 2020-05-22 | Discharge: 2020-05-22 | Payer: MEDICAID

## 2020-05-22 NOTE — Progress Notes
Plan: Home health     Interaction: SW f/u with Wilkie Aye with [facility] health: they have received referral and can accept for start of care tomorrow 9/17. They will call pts mother to arrange first visit.     Harmon Dun, LMSW

## 2020-05-24 ENCOUNTER — Encounter: Admit: 2020-05-24 | Discharge: 2020-05-24 | Payer: MEDICAID

## 2020-05-24 DIAGNOSIS — E039 Hypothyroidism, unspecified: Secondary | ICD-10-CM

## 2020-05-24 DIAGNOSIS — Q909 Down syndrome, unspecified: Secondary | ICD-10-CM

## 2020-05-24 DIAGNOSIS — M810 Age-related osteoporosis without current pathological fracture: Secondary | ICD-10-CM

## 2020-05-24 DIAGNOSIS — K3184 Gastroparesis: Secondary | ICD-10-CM

## 2020-05-24 DIAGNOSIS — Z931 Gastrostomy status: Secondary | ICD-10-CM

## 2020-05-24 DIAGNOSIS — G809 Cerebral palsy, unspecified: Secondary | ICD-10-CM

## 2020-05-24 DIAGNOSIS — K5909 Other constipation: Secondary | ICD-10-CM

## 2020-05-24 DIAGNOSIS — K319 Disease of stomach and duodenum, unspecified: Secondary | ICD-10-CM

## 2020-05-24 DIAGNOSIS — R569 Unspecified convulsions: Secondary | ICD-10-CM

## 2020-05-24 DIAGNOSIS — R001 Bradycardia, unspecified: Secondary | ICD-10-CM

## 2020-05-24 DIAGNOSIS — Q899 Congenital malformation, unspecified: Secondary | ICD-10-CM

## 2020-05-24 DIAGNOSIS — H9190 Unspecified hearing loss, unspecified ear: Secondary | ICD-10-CM

## 2020-06-09 ENCOUNTER — Encounter: Admit: 2020-06-09 | Discharge: 2020-06-09 | Payer: MEDICAID

## 2020-06-09 MED ORDER — LEVOTHYROXINE 50 MCG PO TAB
50 ug | ORAL_TABLET | Freq: Every day | ORAL | 0 refills
Start: 2020-06-09 — End: ?

## 2020-06-26 ENCOUNTER — Encounter: Admit: 2020-06-26 | Discharge: 2020-06-26 | Payer: MEDICAID

## 2020-06-26 DIAGNOSIS — R569 Unspecified convulsions: Secondary | ICD-10-CM

## 2020-06-26 DIAGNOSIS — M810 Age-related osteoporosis without current pathological fracture: Secondary | ICD-10-CM

## 2020-06-26 DIAGNOSIS — K5909 Other constipation: Secondary | ICD-10-CM

## 2020-06-26 DIAGNOSIS — H9190 Unspecified hearing loss, unspecified ear: Secondary | ICD-10-CM

## 2020-06-26 DIAGNOSIS — Z931 Gastrostomy status: Secondary | ICD-10-CM

## 2020-06-26 DIAGNOSIS — Q909 Down syndrome, unspecified: Secondary | ICD-10-CM

## 2020-06-26 DIAGNOSIS — Q899 Congenital malformation, unspecified: Secondary | ICD-10-CM

## 2020-06-26 DIAGNOSIS — R001 Bradycardia, unspecified: Secondary | ICD-10-CM

## 2020-06-26 DIAGNOSIS — G809 Cerebral palsy, unspecified: Secondary | ICD-10-CM

## 2020-06-26 DIAGNOSIS — E039 Hypothyroidism, unspecified: Secondary | ICD-10-CM

## 2020-06-26 DIAGNOSIS — K3184 Gastroparesis: Secondary | ICD-10-CM

## 2020-06-26 DIAGNOSIS — K319 Disease of stomach and duodenum, unspecified: Secondary | ICD-10-CM

## 2020-06-26 MED ORDER — CLORAZEPATE DIPOTASSIUM 3.75 MG PO TAB
3.75 mg | ORAL_TABLET | Freq: Three times a day (TID) | GASTROSTOMY | 3 refills | 6.00000 days | Status: DC
Start: 2020-06-26 — End: 2020-06-26

## 2020-06-26 MED ORDER — LEVETIRACETAM 100 MG/ML PO SOLN
Freq: Two times a day (BID) | ORAL | 11 refills | 90.00000 days | Status: AC
Start: 2020-06-26 — End: ?

## 2020-06-26 MED ORDER — CLORAZEPATE DIPOTASSIUM 3.75 MG PO TAB
3.75 mg | ORAL_TABLET | Freq: Three times a day (TID) | GASTROSTOMY | 5 refills | 6.00000 days | Status: AC
Start: 2020-06-26 — End: ?

## 2020-06-26 MED ORDER — GLYCOPYRROLATE 1 MG PO TAB
1 mg | ORAL_TABLET | Freq: Three times a day (TID) | ORAL | 11 refills | 90.00000 days | Status: AC
Start: 2020-06-26 — End: ?

## 2020-06-26 MED ORDER — LAMOTRIGINE 100 MG PO TAB
100 mg | ORAL_TABLET | Freq: Two times a day (BID) | ORAL | 11 refills | Status: AC
Start: 2020-06-26 — End: ?

## 2020-06-27 ENCOUNTER — Ambulatory Visit: Admit: 2020-06-26 | Discharge: 2020-06-27 | Payer: MEDICARE

## 2020-06-27 DIAGNOSIS — G40319 Generalized idiopathic epilepsy and epileptic syndromes, intractable, without status epilepticus: Principal | ICD-10-CM

## 2020-08-04 IMAGING — CR CHEST
1 series · 1 of 1 positions shown · non-contrast
Comparison: none

[chest port]
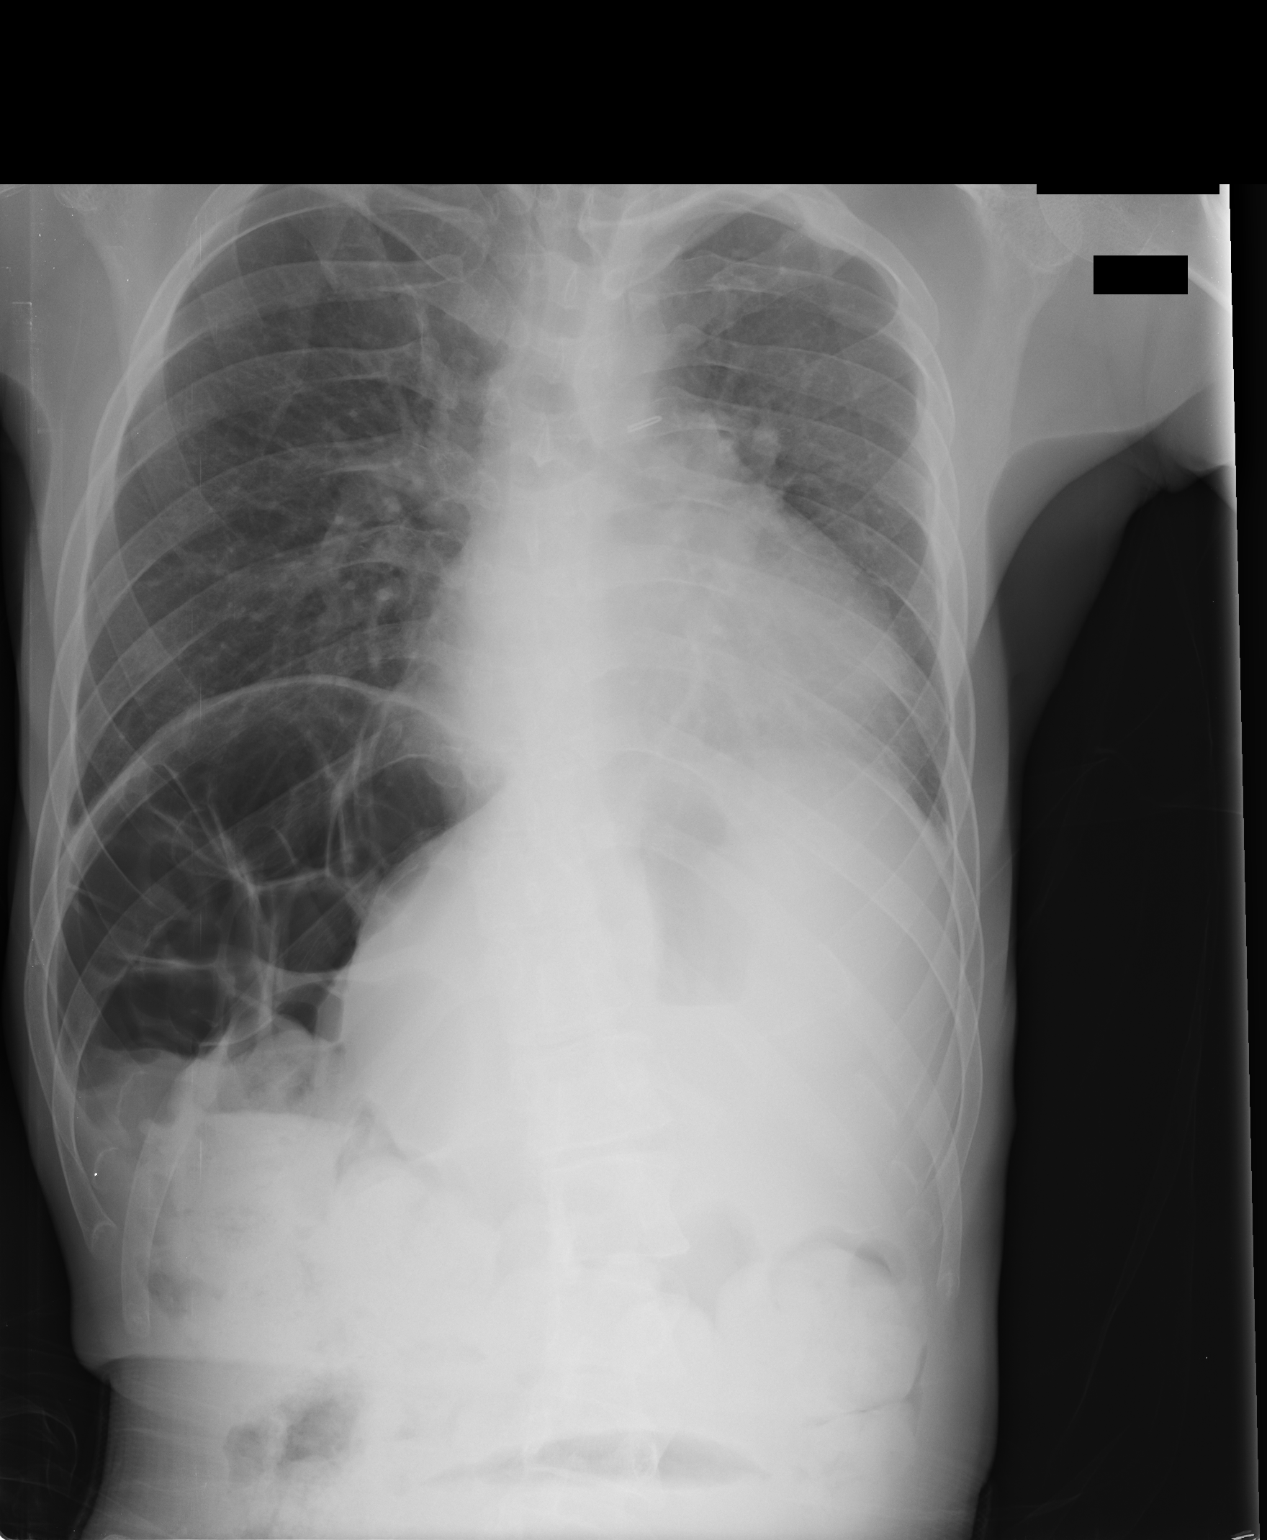

[1 of 1 positions shown; findings below may reference images not displayed]

EXAM

XR chest 1V

INDICATION

Fever, cough

TECHNIQUE

Single view of the chest

COMPARISONS

08/16/18

FINDINGS

No radiographically apparent pleural effusion, consolidation, or pneumothorax.

The cardiomediastinal silhouette is  borderline enlarged.

The osseous structures are without an acute osseous abnormality. Surgical clips projecting within
the mediastinum.

Gas distention of multiple colonic loops underneath the right hemidiaphragm. Possible masslike
appearance within the left upper quadrant/upper abdomen with appearance of inferior displacement of
the transverse colon. Consider further evaluation with CT if clinically indicated.

IMPRESSION
1. No radiographic evidence of an acute cardiopulmonary process.
2. Please see above for upper abdominal findings.

Tech Notes:

PT c/o fever x 2 weeks. JC/LM

## 2020-08-12 ENCOUNTER — Encounter: Admit: 2020-08-12 | Discharge: 2020-08-12 | Payer: MEDICARE

## 2020-08-12 ENCOUNTER — Ambulatory Visit: Admit: 2020-08-12 | Discharge: 2020-08-12 | Payer: MEDICARE

## 2020-08-12 ENCOUNTER — Encounter: Admit: 2020-08-12 | Discharge: 2020-08-12 | Payer: MEDICAID

## 2020-08-12 DIAGNOSIS — E039 Hypothyroidism, unspecified: Secondary | ICD-10-CM

## 2020-08-12 DIAGNOSIS — M810 Age-related osteoporosis without current pathological fracture: Secondary | ICD-10-CM

## 2020-08-12 DIAGNOSIS — H9190 Unspecified hearing loss, unspecified ear: Secondary | ICD-10-CM

## 2020-08-12 DIAGNOSIS — Q899 Congenital malformation, unspecified: Secondary | ICD-10-CM

## 2020-08-12 DIAGNOSIS — R569 Unspecified convulsions: Secondary | ICD-10-CM

## 2020-08-12 DIAGNOSIS — G809 Cerebral palsy, unspecified: Secondary | ICD-10-CM

## 2020-08-12 DIAGNOSIS — K319 Disease of stomach and duodenum, unspecified: Secondary | ICD-10-CM

## 2020-08-12 DIAGNOSIS — N319 Neuromuscular dysfunction of bladder, unspecified: Secondary | ICD-10-CM

## 2020-08-12 DIAGNOSIS — R339 Retention of urine, unspecified: Secondary | ICD-10-CM

## 2020-08-12 DIAGNOSIS — N35914 Unspecified anterior urethral stricture, male: Secondary | ICD-10-CM

## 2020-08-12 DIAGNOSIS — R001 Bradycardia, unspecified: Secondary | ICD-10-CM

## 2020-08-12 DIAGNOSIS — K3184 Gastroparesis: Secondary | ICD-10-CM

## 2020-08-12 DIAGNOSIS — K5909 Other constipation: Secondary | ICD-10-CM

## 2020-08-12 DIAGNOSIS — Q909 Down syndrome, unspecified: Secondary | ICD-10-CM

## 2020-08-12 DIAGNOSIS — Z931 Gastrostomy status: Secondary | ICD-10-CM

## 2020-08-12 NOTE — Assessment & Plan Note
He is doing well with the catheter without any recent UTIs. He got a renal bladder ultrasound today, which shows resolution of the hydronephrosis on my read (final read pending). Home health is changing the catheter.    - Follow up in 6 months for clinical update

## 2020-08-18 ENCOUNTER — Encounter: Admit: 2020-08-18 | Discharge: 2020-08-18 | Payer: MEDICAID

## 2020-08-18 NOTE — Telephone Encounter
I called pt to discuss results.  I was unable to get in touch with the pt and left a VM.  Message also sent through MyChart

## 2020-08-19 ENCOUNTER — Encounter: Admit: 2020-08-19 | Discharge: 2020-08-19 | Payer: MEDICAID

## 2020-08-19 DIAGNOSIS — K5909 Other constipation: Secondary | ICD-10-CM

## 2020-08-19 DIAGNOSIS — K3184 Gastroparesis: Secondary | ICD-10-CM

## 2020-08-19 DIAGNOSIS — M810 Age-related osteoporosis without current pathological fracture: Secondary | ICD-10-CM

## 2020-08-19 DIAGNOSIS — R001 Bradycardia, unspecified: Secondary | ICD-10-CM

## 2020-08-19 DIAGNOSIS — E039 Hypothyroidism, unspecified: Secondary | ICD-10-CM

## 2020-08-19 DIAGNOSIS — Z931 Gastrostomy status: Secondary | ICD-10-CM

## 2020-08-19 DIAGNOSIS — Q909 Down syndrome, unspecified: Secondary | ICD-10-CM

## 2020-08-19 DIAGNOSIS — K319 Disease of stomach and duodenum, unspecified: Secondary | ICD-10-CM

## 2020-08-19 DIAGNOSIS — Q899 Congenital malformation, unspecified: Secondary | ICD-10-CM

## 2020-08-19 DIAGNOSIS — R569 Unspecified convulsions: Secondary | ICD-10-CM

## 2020-08-19 DIAGNOSIS — G809 Cerebral palsy, unspecified: Secondary | ICD-10-CM

## 2020-08-19 DIAGNOSIS — H9190 Unspecified hearing loss, unspecified ear: Secondary | ICD-10-CM

## 2020-10-14 ENCOUNTER — Encounter: Admit: 2020-10-14 | Discharge: 2020-10-14 | Payer: MEDICARE

## 2020-10-14 ENCOUNTER — Ambulatory Visit: Admit: 2020-10-14 | Discharge: 2020-10-15 | Payer: MEDICARE

## 2020-10-14 DIAGNOSIS — M810 Age-related osteoporosis without current pathological fracture: Principal | ICD-10-CM

## 2020-10-14 MED ORDER — DENOSUMAB 60 MG/ML SC SYRG
60 mg | Freq: Once | SUBCUTANEOUS | 0 refills | Status: CP
Start: 2020-10-14 — End: ?
  Administered 2020-10-14: 18:00:00 60 mg via SUBCUTANEOUS

## 2020-10-14 MED ORDER — DENOSUMAB 60 MG/ML SC SYRG
60 mg | Freq: Once | SUBCUTANEOUS | 0 refills | Status: CN
Start: 2020-10-14 — End: ?

## 2020-10-14 NOTE — Patient Instructions
Post infusion emergency medical treatment: Go to the closest Emergency Department or call 911. For non-urgent questions or concerns, call [phone] after 0900 the following day(including weekends & holidays). For urgent medical questions, call [phone] and ask to speak to the On-Call Physician covering for the physician prescribing your infusion medication.

## 2020-12-22 ENCOUNTER — Encounter: Admit: 2020-12-22 | Discharge: 2020-12-22 | Payer: MEDICARE

## 2020-12-22 DIAGNOSIS — G40319 Generalized idiopathic epilepsy and epileptic syndromes, intractable, without status epilepticus: Secondary | ICD-10-CM

## 2020-12-22 MED ORDER — LEVETIRACETAM 100 MG/ML PO SOLN
Freq: Two times a day (BID) | ORAL | 3 refills | 90.00000 days | Status: AC
Start: 2020-12-22 — End: ?

## 2021-01-21 ENCOUNTER — Encounter: Admit: 2021-01-21 | Discharge: 2021-01-21 | Payer: MEDICARE

## 2021-01-21 MED ORDER — CLORAZEPATE DIPOTASSIUM 3.75 MG PO TAB
ORAL_TABLET | Freq: Three times a day (TID) | ORAL | 0 refills | 6.00000 days | Status: AC
Start: 2021-01-21 — End: ?

## 2021-02-10 ENCOUNTER — Encounter: Admit: 2021-02-10 | Discharge: 2021-02-10 | Payer: MEDICARE

## 2021-02-10 DIAGNOSIS — H9190 Unspecified hearing loss, unspecified ear: Secondary | ICD-10-CM

## 2021-02-10 DIAGNOSIS — R339 Retention of urine, unspecified: Secondary | ICD-10-CM

## 2021-02-10 DIAGNOSIS — K319 Disease of stomach and duodenum, unspecified: Secondary | ICD-10-CM

## 2021-02-10 DIAGNOSIS — M810 Age-related osteoporosis without current pathological fracture: Secondary | ICD-10-CM

## 2021-02-10 DIAGNOSIS — N35914 Unspecified anterior urethral stricture, male: Secondary | ICD-10-CM

## 2021-02-10 DIAGNOSIS — Q899 Congenital malformation, unspecified: Secondary | ICD-10-CM

## 2021-02-10 DIAGNOSIS — E039 Hypothyroidism, unspecified: Secondary | ICD-10-CM

## 2021-02-10 DIAGNOSIS — K3184 Gastroparesis: Secondary | ICD-10-CM

## 2021-02-10 DIAGNOSIS — Z931 Gastrostomy status: Secondary | ICD-10-CM

## 2021-02-10 DIAGNOSIS — R569 Unspecified convulsions: Secondary | ICD-10-CM

## 2021-02-10 DIAGNOSIS — R001 Bradycardia, unspecified: Secondary | ICD-10-CM

## 2021-02-10 DIAGNOSIS — K5909 Other constipation: Secondary | ICD-10-CM

## 2021-02-10 DIAGNOSIS — G809 Cerebral palsy, unspecified: Secondary | ICD-10-CM

## 2021-02-10 DIAGNOSIS — Q909 Down syndrome, unspecified: Secondary | ICD-10-CM

## 2021-02-10 NOTE — Progress Notes
Date of Service: 02/10/2021    Subjective:             Marcus Gibbs is a 45 y.o. male.    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  Patient is noncommunicative and the history and updates come from his mother.  He has a 14 [place] silicone catheter that is being exchanged by home health.  The catheter is being tucked into his diaper so that he does not have to be hooked to a drainage bag.  His mother reports that he has been doing very well with this.  She reports that he has not had any recent urinary tract infections.  She reports the catheter was exchanged 1 month ago and she would like [place] to exchange the catheter today in clinic.  He reports good urine output from the catheter based on his diaper saturation.      Medical History:   Diagnosis Date   ? Birth defect    ? Bradycardia    ? Chronic constipation    ? Convulsion (HCC)    ? CP (cerebral palsy) (HCC)     MILD   ? Down syndrome    ? Gastroparesis     [facility]   ? Gastrostomy in place [facility])    ? Hearing problem    ? Hypothyroidism    ? Osteoporosis    ? Seizures (HCC)    ? Stomach problems      Surgical History:   Procedure Laterality Date   ? APPENDECTOMY N/A 05/1994    removed   ? EAR TUBES N/A 1999    both ears   ? PATENT DUCTUS ARTERIOUS LIGATION N/A 2000   ? PERICARDIOCENTESIS  2016   ? Pericardiocentesis N/A 02/27/2015    Performed by Cath, Physician at [facility] CATH LAB   ? CYSTOURETHROSCOPY AND RETROGRADE URETHROGRAM N/A 10/29/2019    Performed by Marylen Ponto, MD at [facility] OR   ? ESOPHAGOGASTRIC FUNDOPLICATION     ? GASTRIC FUNDOPLICATION N/A    ? GASTROSTOMY TUBE CHANGE      Bard Button Replacement   ? GASTROSTOMY TUBE PLACEMENT     ? HX APPENDECTOMY     ? 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 HX KNEE SURGERY      disloaction both knees   ? KNEE SURGERY Right     Right knee Patella Dislocation and Hamstring     Family History   Problem Relation Age of Onset   ? Diabetes Mother    ? Hypertension Mother    ? High Cholesterol Mother    ? Thyroid Disease Mother    ? High Cholesterol Father    ? Hypertension Father    ? Arthritis-osteo Father    ? Gout Father      Social History     Socioeconomic History   ? Marital status: Single   Occupational History     Employer: DISABLED   Tobacco Use   ? Smoking status: Never Smoker   ? Smokeless tobacco: Never Used   Vaping Use   ? Vaping Use: Never used   Substance and Sexual Activity   ? Alcohol use: No     Alcohol/week: 0.0 standard drinks   ? Drug use: No   ? Sexual activity: Never     Vaping/E-liquid Use   ?  Vaping Use Never User                    Review of Systems   Unable to perform ROS: Patient nonverbal         Objective:         ? calcium carbonate (TUMS E-X) 300 mg (750 mg) chewable tablet Chew 1 tablet by mouth daily. Puts in cereal   ? calcium carbonate/vitamin D3 (VITAMIN D-3 PO) Take 3 tablespoons by feeding tube twice weekly (on Tuesday and Friday)    1 serving = 2 Tablespoons  Calcium citrate: 1,000 mg per serving  Magnesium (doesn't indicate amount)  Vitamin D 25 mg per serving   ? chlorhexidine gluconate (PERIDEX) 0.12 % solution Take 15 mL by mouth twice daily.   ? clorazepate dipotassium (TRANXENE) 3.75 mg tablet TAKE 1 TABLET VIA FEEDING TUBE 3 TIMES A DAY   ? denosumab (PROLIA) 60 mg/mL injection Inject subcutaneous every 6 months  Indications: every 6 months   ? famotidine (PEPCID) 40 mg/5 mL (8 mg/mL) susp oral suspension Take 40 mg by mouth twice daily.   ? fexofenadine (ALLEGRA) 30 mg/5 mL suspension Take 60 mg by mouth twice daily.   ? fluticasone (FLONASE) 50 mcg/actuation nasal spray Apply 2 Sprays to each nostril as directed daily.   ? glycopyrrolate (ROBINUL) 1 mg tablet Take one tablet by mouth three times daily.   ? LACTOSE-REDUCED FOOD (ENSURE PO) Take 2 Cans by mouth daily.   ? lamoTRIgine (LAMICTAL) 100 mg tablet Take one tablet by mouth twice daily. Take 1 tab twice daily via feeding tube   ? levETIRAcetam (KEPPRA) 100 mg/mL oral solution GIVE 20 ML VIA FEEDING GASTROSTOMY TWICE DAILY (Patient taking differently: GIVE 20 ML VIA FEEDING GASTROSTOMY in the morning and at night)   ? levothyroxine (SYNTHROID) 50 mcg tablet Take one tablet by mouth daily 30 minutes before breakfast.   ? nitrofurantoin macrocrystaL (MACRODANTIN) 50 mg capsule Take 50 mg by mouth at bedtime daily. Take with food.   ? Nutritional Supplements liqd Puts in ensure product. 1 packet per day.   ? omeprazole DR (PRILOSEC) 20 mg capsule Take 20 mg by mouth daily as needed.   ? polyethylene glycol 3350 (MIRALAX) 17 g packet Take 17 g via feeding tube at bedtime daily.     Vitals:    02/10/21 1426   BP: 109/55   BP Source: Arm, Right Upper   Pulse: 46   Temp: 37.2 ?C (99 ?F)   TempSrc: Temporal   Weight: 26.8 kg (59 lb)  Comment: pt unable to stand. wheel chair approximate wt, 90lbs     Body mass index is 13.23 kg/m?Marland Kitchen     Physical Exam  Constitutional:       General: He is not in acute distress.     Appearance: He is well-developed. He is not diaphoretic.      Comments: Non-verbal   HENT:      Head: Normocephalic and atraumatic.      Nose: Nose normal.   Eyes:      General: No scleral icterus.        Right eye: No discharge.         Left eye: No discharge.   Cardiovascular:      Rate and Rhythm: Normal rate.   Pulmonary:      Effort: Pulmonary effort is normal. No respiratory distress.   Genitourinary:     Comments: normal phallus, circumcised, no lesions  14 French silicone catheter, balloon was deflated and 4 cc in the balloon.  Catheter was removed and was clearly not all the way into the bladder.  The balloon was likely inflated in the urethra.  14 French silicone catheter was advanced into the bladder under sterile technique in the standard fashion.  A large amount of urine returned and the balloon was inflated with 10 cc of sterile water.  Musculoskeletal: General: No deformity.   Neurological:      Mental Status: Mental status is at baseline.   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  It was evident that the catheter was blown up in the urethra as a large amount of the catheter was remaining outside of the patient's penis.  There is also a large amount of urine consistent with retained urine.  I asked her to review this with home health and to be happy discussed with them in the future.  We will continue management with a 14 French indwelling silicone catheter for urinary retention and his urethral stricture as the mother would like to avoid surgery.  We will have him return to clinic in 6 months with repeat ultrasound.

## 2021-03-20 ENCOUNTER — Encounter: Admit: 2021-03-20 | Discharge: 2021-03-20 | Payer: MEDICARE

## 2021-03-20 ENCOUNTER — Ambulatory Visit: Admit: 2021-03-20 | Discharge: 2021-03-20 | Payer: MEDICARE

## 2021-03-20 DIAGNOSIS — M810 Age-related osteoporosis without current pathological fracture: Secondary | ICD-10-CM

## 2021-03-20 DIAGNOSIS — K3184 Gastroparesis: Secondary | ICD-10-CM

## 2021-03-20 DIAGNOSIS — G809 Cerebral palsy, unspecified: Secondary | ICD-10-CM

## 2021-03-20 DIAGNOSIS — Z931 Gastrostomy status: Secondary | ICD-10-CM

## 2021-03-20 DIAGNOSIS — R569 Unspecified convulsions: Secondary | ICD-10-CM

## 2021-03-20 DIAGNOSIS — Q899 Congenital malformation, unspecified: Secondary | ICD-10-CM

## 2021-03-20 DIAGNOSIS — Q909 Down syndrome, unspecified: Secondary | ICD-10-CM

## 2021-03-20 DIAGNOSIS — R001 Bradycardia, unspecified: Secondary | ICD-10-CM

## 2021-03-20 DIAGNOSIS — H9190 Unspecified hearing loss, unspecified ear: Secondary | ICD-10-CM

## 2021-03-20 DIAGNOSIS — E039 Hypothyroidism, unspecified: Secondary | ICD-10-CM

## 2021-03-20 DIAGNOSIS — K319 Disease of stomach and duodenum, unspecified: Secondary | ICD-10-CM

## 2021-03-20 DIAGNOSIS — K5909 Other constipation: Secondary | ICD-10-CM

## 2021-03-20 DIAGNOSIS — M818 Other osteoporosis without current pathological fracture: Secondary | ICD-10-CM

## 2021-03-20 LAB — BASIC METABOLIC PANEL
ANION GAP: 10 (ref 3–12)
BLD UREA NITROGEN: 17 mg/dL (ref 7–25)
CALCIUM: 9.4 mg/dL (ref 8.5–10.6)
CHLORIDE: 104 MMOL/L (ref 98–110)
CO2: 25 MMOL/L (ref 21–30)
CREATININE: 0.9 mg/dL (ref 0.4–1.24)
EGFR: >60 mL/min (ref >60)
GLUCOSE,PANEL: 87 mg/dL (ref 70–100)
POTASSIUM: 4.3 MMOL/L (ref 3.5–5.1)
SODIUM: 139 MMOL/L (ref 137–147)

## 2021-03-20 LAB — TSH WITH FREE T4 REFLEX: TSH: 3.8 uU/mL (ref 0.35–5.00)

## 2021-03-20 MED ORDER — DENOSUMAB 60 MG/ML SC SYRG
60 mg | Freq: Once | SUBCUTANEOUS | 0 refills
Start: 2021-03-20 — End: ?

## 2021-03-20 NOTE — Progress Notes
Date of Service: 03/20/2021    Subjective:           Marcus Gibbs is a 45 y.o. male with history of Down Syndrome, seizure disorder, hypothyroidism and osteoporosis who presented to endocrinology clinic for follow up of his osteoporosis. History is provided by mother, Marcus Gibbs, since patient is nonverbal.     History of Present Illness  Interval History  Marcus Gibbs is continuing to act like it's painful to walk-- sometimes wincing and seems more resistant to getting out of his chair. He is able to weight bear, but is resistant to getting out of the chair.  He does not smoke or drink alcohol.   He is taking liquid calcium/vitamin D through his feeding tube at 1200 mg calcium per day with 25 mcg of vitamin D  Falls in the last 6 months: none  Patient's assessment of gait and balance: not ambulating frequently   Pain in legs, hip, pelvis, or groin: nonverbal  Exercise: none  Loose or bothersome teeth: none  Steroid therapy since last visit to clinic: none       Review of Systems  No ROS due to patient being nonverbal     Objective:         ? calcium carbonate (TUMS E-X) 300 mg (750 mg) chewable tablet Chew 1 tablet by mouth daily. Puts in cereal   ? calcium carbonate/vitamin D3 (VITAMIN D-3 PO) Take 3 tablespoons by feeding tube twice weekly (on Tuesday and Friday)    1 serving = 2 Tablespoons  Calcium citrate: 1,000 mg per serving  Magnesium (doesn't indicate amount)  Vitamin D 25 mg per serving   ? chlorhexidine gluconate (PERIDEX) 0.12 % solution Take 15 mL by mouth twice daily.   ? clorazepate dipotassium (TRANXENE) 3.75 mg tablet TAKE 1 TABLET VIA FEEDING TUBE 3 TIMES A DAY   ? denosumab (PROLIA) 60 mg/mL injection Inject subcutaneous every 6 months  Indications: every 6 months   ? famotidine (PEPCID) 40 mg/5 mL (8 mg/mL) susp oral suspension Take 40 mg by mouth twice daily.   ? fexofenadine (ALLEGRA) 30 mg/5 mL suspension Take 60 mg by mouth twice daily.   ? fluticasone (FLONASE) 50 mcg/actuation nasal spray Apply 2 Sprays to each nostril as directed daily.   ? glycopyrrolate (ROBINUL) 1 mg tablet Take one tablet by mouth three times daily.   ? LACTOSE-REDUCED FOOD (ENSURE PO) Take 2 Cans by mouth daily.   ? lamoTRIgine (LAMICTAL) 100 mg tablet Take one tablet by mouth twice daily. Take 1 tab twice daily via feeding tube   ? levETIRAcetam (KEPPRA) 100 mg/mL oral solution GIVE 20 ML VIA FEEDING GASTROSTOMY TWICE DAILY (Patient taking differently: GIVE 20 ML VIA FEEDING GASTROSTOMY in the morning and at night)   ? levothyroxine (SYNTHROID) 50 mcg tablet Take one tablet by mouth daily 30 minutes before breakfast.   ? nitrofurantoin macrocrystaL (MACRODANTIN) 50 mg capsule Take 50 mg by mouth at bedtime daily. Take with food.   ? Nutritional Supplements liqd Puts in ensure product. 1 packet per day.   ? omeprazole DR (PRILOSEC) 20 mg capsule Take 20 mg by mouth daily as needed.   ? polyethylene glycol 3350 (MIRALAX) 17 g packet Take 17 g via feeding tube at bedtime daily.           Physical Exam  Vitals and nursing note reviewed.   Constitutional:       Comments: Wheelchair bound. History was provided by mother   Musculoskeletal:  Comments: Leaning to the L side in the wheelchair. Does have visible scoliosis   Skin:     General: Skin is warm and dry.   Neurological:      Mental Status: He is alert. Mental status is at baseline.         Labs reviewed        Assessment and Plan:  Osteoporosis   - Treatment history: reclast x 5, prolia x 9 starting in 2016. Missed one dose due to the pandemic. Most recent dose 10/2020.   - Tolerates the prolia well.   - Repeat DEXA   - Plan to continue prolia every 6 months   - Repeat BMP and vitamin D     Hypothyroidism   - Repeat TSH     RTC in 1 year     Marcus Gibbs M Avin Gibbons, PA-C                         Total Time Today was 30 minutes in the following activities: Preparing to see the patient, Obtaining and/or reviewing separately obtained history, Performing a medically appropriate examination and/or evaluation, Counseling and educating the patient/family/caregiver, Ordering medications, tests, or procedures, Documenting clinical information in the electronic or other health record, Independently interpreting results (not separately reported) and communicating results to the patient/family/caregiver and Care coordination (not separately reported)

## 2021-03-21 ENCOUNTER — Ambulatory Visit: Admit: 2021-03-20 | Discharge: 2021-03-21 | Payer: MEDICARE

## 2021-03-21 DIAGNOSIS — E039 Hypothyroidism, unspecified: Secondary | ICD-10-CM

## 2021-03-21 DIAGNOSIS — M818 Other osteoporosis without current pathological fracture: Secondary | ICD-10-CM

## 2021-03-23 ENCOUNTER — Encounter: Admit: 2021-03-23 | Discharge: 2021-03-23 | Payer: MEDICARE

## 2021-03-23 ENCOUNTER — Ambulatory Visit: Admit: 2021-03-23 | Discharge: 2021-03-24 | Payer: MEDICARE

## 2021-03-23 MED ORDER — CLORAZEPATE DIPOTASSIUM 3.75 MG PO TAB
ORAL_TABLET | Freq: Three times a day (TID) | 1 refills
Start: 2021-03-23 — End: ?

## 2021-03-24 ENCOUNTER — Encounter: Admit: 2021-03-24 | Discharge: 2021-03-24 | Payer: MEDICARE

## 2021-03-24 MED ORDER — CLORAZEPATE DIPOTASSIUM 3.75 MG PO TAB
3.75 mg | ORAL_TABLET | Freq: Three times a day (TID) | GASTROSTOMY | 1 refills | 4.00000 days | Status: AC
Start: 2021-03-24 — End: ?

## 2021-03-24 MED ORDER — GLYCOPYRROLATE 1 MG PO TAB
ORAL_TABLET | Freq: Three times a day (TID) | 3 refills
Start: 2021-03-24 — End: ?

## 2021-04-15 IMAGING — CR CHEST
1 series · 1 of 1 positions shown · non-contrast
Comparison: May 07, 2019

EXAM:  XR CHEST, 1 VIEW  (46849)
INDICATION: ATH cough/congestion cough/congestion

[chest port x-wise]
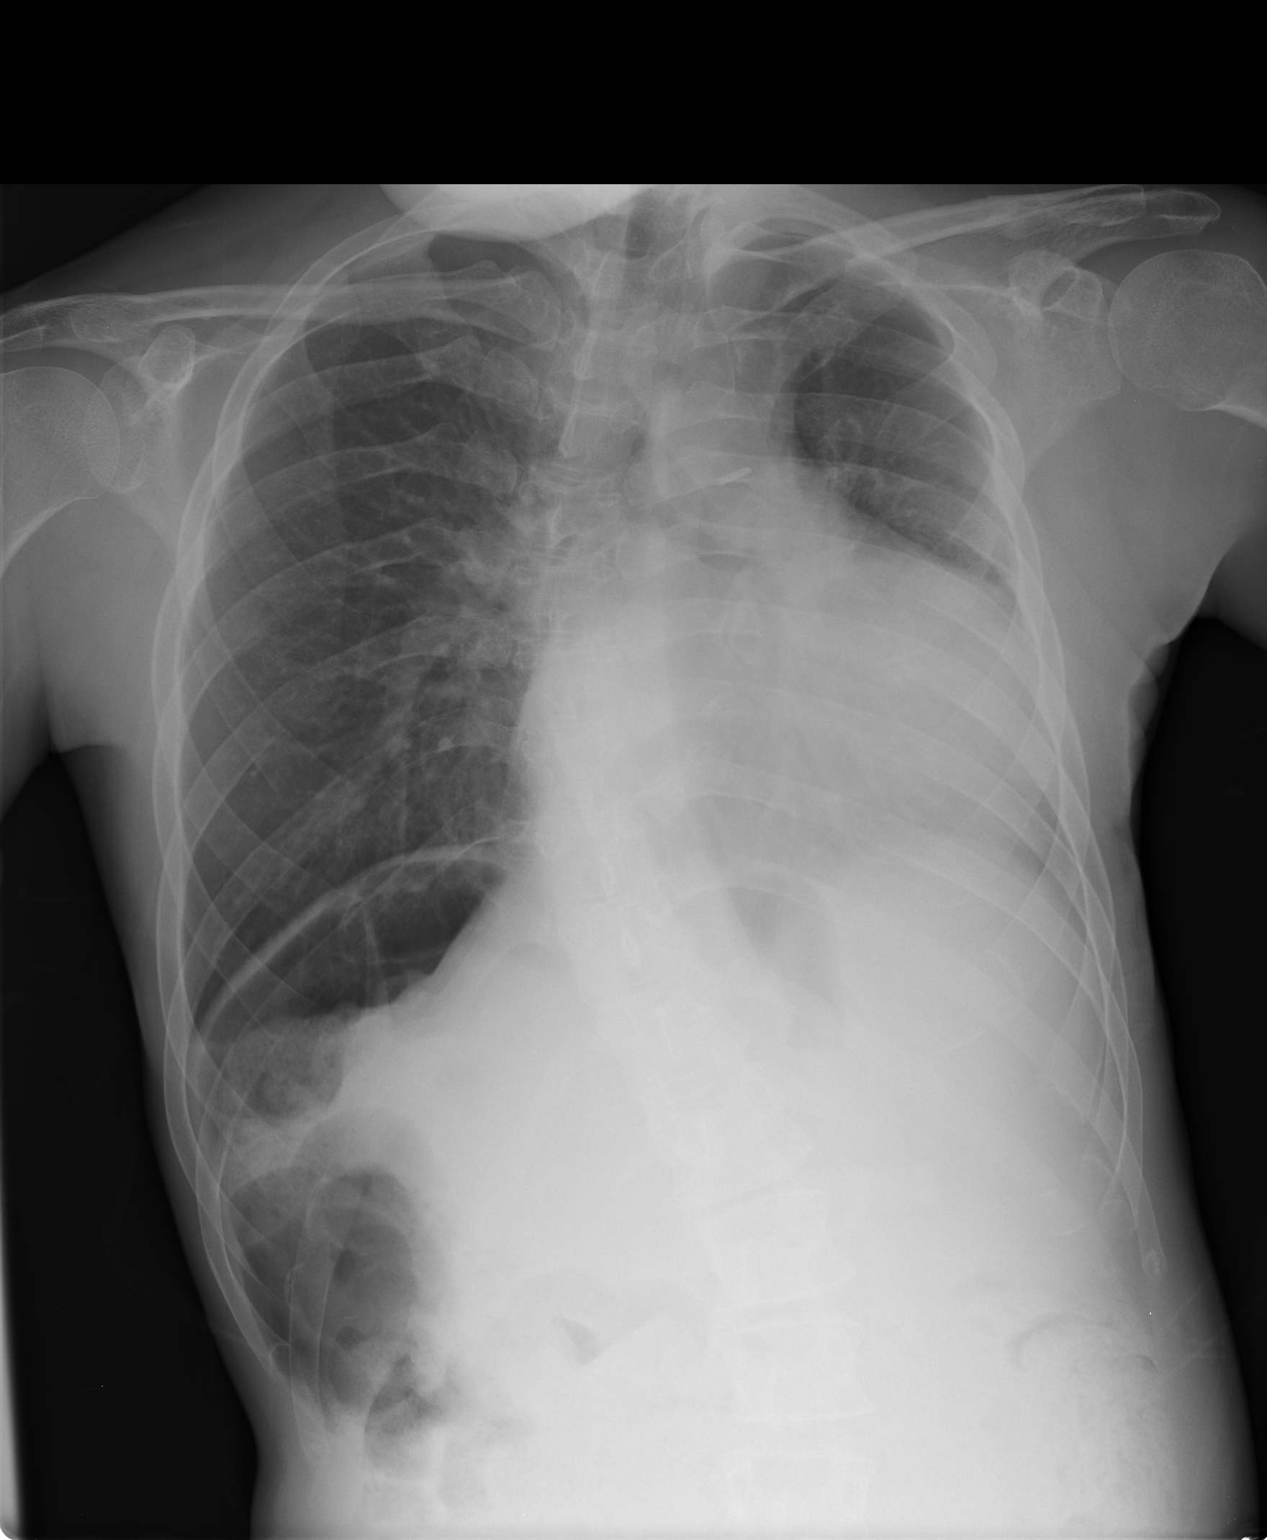

[1 of 1 positions shown; findings below may reference images not displayed]

FINDINGS: LUNGS:  Central bronchial wall interstitial thickening seen with central airway inflammatory and/or
viral process. Consider viral pneumonia.

PLEURAL SPACE:  No gross effusion.

HEART: Cardiac contour unchanged.

MEDIASTINUM:  Mediastinal contour unchanged.

BONES/JOINTS:  Osseous structures unchanged.

OTHER FINDINGS:  Regression of right-sided subphrenic gas is distension.
IMPRESSION: - Central bronchial wall interstitial thickening seen with central airway inflammatory and/or viral
process. Consider viral pneumonia.

RECOMMENDATIONS:

- Recommend follow-up after treatment to ensure return to baseline.

Tech Notes:

cough/congestion

## 2021-04-21 ENCOUNTER — Encounter: Admit: 2021-04-21 | Discharge: 2021-04-21 | Payer: MEDICARE

## 2021-04-21 DIAGNOSIS — G40319 Generalized idiopathic epilepsy and epileptic syndromes, intractable, without status epilepticus: Secondary | ICD-10-CM

## 2021-04-21 MED ORDER — LAMOTRIGINE 100 MG PO TAB
ORAL_TABLET | Freq: Two times a day (BID) | 3 refills
Start: 2021-04-21 — End: ?

## 2021-04-30 ENCOUNTER — Encounter: Admit: 2021-04-30 | Discharge: 2021-04-30 | Payer: MEDICARE

## 2021-05-01 ENCOUNTER — Ambulatory Visit: Admit: 2021-05-01 | Discharge: 2021-05-01 | Payer: MEDICARE

## 2021-05-01 ENCOUNTER — Encounter: Admit: 2021-05-01 | Discharge: 2021-05-01 | Payer: MEDICARE

## 2021-05-01 DIAGNOSIS — M810 Age-related osteoporosis without current pathological fracture: Principal | ICD-10-CM

## 2021-05-01 MED ORDER — DENOSUMAB 60 MG/ML SC SYRG
60 mg | Freq: Once | SUBCUTANEOUS | 0 refills
Start: 2021-05-01 — End: ?

## 2021-05-01 MED ORDER — DENOSUMAB 60 MG/ML SC SYRG
60 mg | Freq: Once | SUBCUTANEOUS | 0 refills | Status: CP
Start: 2021-05-01 — End: ?
  Administered 2021-05-01: 18:00:00 60 mg via SUBCUTANEOUS

## 2021-05-20 ENCOUNTER — Encounter: Admit: 2021-05-20 | Discharge: 2021-05-20 | Payer: MEDICARE

## 2021-05-20 MED ORDER — CLORAZEPATE DIPOTASSIUM 3.75 MG PO TAB
ORAL_TABLET | Freq: Three times a day (TID) | 1 refills
Start: 2021-05-20 — End: ?

## 2021-07-21 ENCOUNTER — Encounter: Admit: 2021-07-21 | Discharge: 2021-07-21 | Payer: MEDICARE

## 2021-07-21 MED ORDER — CLORAZEPATE DIPOTASSIUM 3.75 MG PO TAB
ORAL_TABLET | Freq: Three times a day (TID) | 1 refills
Start: 2021-07-21 — End: ?

## 2021-08-13 ENCOUNTER — Ambulatory Visit: Admit: 2021-08-13 | Discharge: 2021-08-13 | Payer: No Typology Code available for payment source

## 2021-08-13 ENCOUNTER — Encounter: Admit: 2021-08-13 | Discharge: 2021-08-13 | Payer: MEDICARE

## 2021-08-13 DIAGNOSIS — G809 Cerebral palsy, unspecified: Secondary | ICD-10-CM

## 2021-08-13 DIAGNOSIS — E039 Hypothyroidism, unspecified: Secondary | ICD-10-CM

## 2021-08-13 DIAGNOSIS — K319 Disease of stomach and duodenum, unspecified: Secondary | ICD-10-CM

## 2021-08-13 DIAGNOSIS — Q899 Congenital malformation, unspecified: Secondary | ICD-10-CM

## 2021-08-13 DIAGNOSIS — Q909 Down syndrome, unspecified: Secondary | ICD-10-CM

## 2021-08-13 DIAGNOSIS — H9190 Unspecified hearing loss, unspecified ear: Secondary | ICD-10-CM

## 2021-08-13 DIAGNOSIS — K5909 Other constipation: Secondary | ICD-10-CM

## 2021-08-13 DIAGNOSIS — R569 Unspecified convulsions: Secondary | ICD-10-CM

## 2021-08-13 DIAGNOSIS — R001 Bradycardia, unspecified: Secondary | ICD-10-CM

## 2021-08-13 DIAGNOSIS — Z931 Gastrostomy status: Secondary | ICD-10-CM

## 2021-08-13 DIAGNOSIS — G40319 Generalized idiopathic epilepsy and epileptic syndromes, intractable, without status epilepticus: Secondary | ICD-10-CM

## 2021-08-13 DIAGNOSIS — G40109 Localization-related (focal) (partial) symptomatic epilepsy and epileptic syndromes with simple partial seizures, not intractable, without status epilepticus: Secondary | ICD-10-CM

## 2021-08-13 DIAGNOSIS — K3184 Gastroparesis: Secondary | ICD-10-CM

## 2021-08-13 DIAGNOSIS — M810 Age-related osteoporosis without current pathological fracture: Secondary | ICD-10-CM

## 2021-08-13 MED ORDER — LEVETIRACETAM 100 MG/ML PO SOLN
ORAL | 5 refills | 90.00000 days | Status: AC
Start: 2021-08-13 — End: ?

## 2021-08-13 MED ORDER — LAMOTRIGINE 100 MG PO TAB
100 mg | ORAL_TABLET | Freq: Two times a day (BID) | NASOGASTRIC | 3 refills | Status: AC
Start: 2021-08-13 — End: ?

## 2021-09-04 ENCOUNTER — Encounter: Admit: 2021-09-04 | Discharge: 2021-09-04 | Payer: MEDICARE

## 2021-09-04 ENCOUNTER — Ambulatory Visit: Admit: 2021-09-04 | Discharge: 2021-09-04 | Payer: MEDICARE

## 2021-09-04 DIAGNOSIS — N35914 Unspecified anterior urethral stricture, male: Secondary | ICD-10-CM

## 2021-09-04 DIAGNOSIS — R339 Retention of urine, unspecified: Secondary | ICD-10-CM

## 2021-09-08 ENCOUNTER — Encounter: Admit: 2021-09-08 | Discharge: 2021-09-08 | Payer: MEDICARE

## 2021-09-08 DIAGNOSIS — K5909 Other constipation: Secondary | ICD-10-CM

## 2021-09-08 DIAGNOSIS — G809 Cerebral palsy, unspecified: Secondary | ICD-10-CM

## 2021-09-08 DIAGNOSIS — K319 Disease of stomach and duodenum, unspecified: Secondary | ICD-10-CM

## 2021-09-08 DIAGNOSIS — N319 Neuromuscular dysfunction of bladder, unspecified: Secondary | ICD-10-CM

## 2021-09-08 DIAGNOSIS — R001 Bradycardia, unspecified: Secondary | ICD-10-CM

## 2021-09-08 DIAGNOSIS — M810 Age-related osteoporosis without current pathological fracture: Secondary | ICD-10-CM

## 2021-09-08 DIAGNOSIS — R569 Unspecified convulsions: Secondary | ICD-10-CM

## 2021-09-08 DIAGNOSIS — Q909 Down syndrome, unspecified: Secondary | ICD-10-CM

## 2021-09-08 DIAGNOSIS — Q899 Congenital malformation, unspecified: Secondary | ICD-10-CM

## 2021-09-08 DIAGNOSIS — E039 Hypothyroidism, unspecified: Secondary | ICD-10-CM

## 2021-09-08 DIAGNOSIS — K3184 Gastroparesis: Secondary | ICD-10-CM

## 2021-09-08 DIAGNOSIS — H9190 Unspecified hearing loss, unspecified ear: Secondary | ICD-10-CM

## 2021-09-08 DIAGNOSIS — Z931 Gastrostomy status: Secondary | ICD-10-CM

## 2021-10-12 ENCOUNTER — Encounter: Admit: 2021-10-12 | Discharge: 2021-10-12 | Payer: MEDICARE

## 2021-10-12 DIAGNOSIS — R569 Unspecified convulsions: Secondary | ICD-10-CM

## 2021-10-30 ENCOUNTER — Encounter: Admit: 2021-10-30 | Discharge: 2021-10-30 | Payer: MEDICARE

## 2021-10-30 ENCOUNTER — Ambulatory Visit: Admit: 2021-10-30 | Discharge: 2021-10-31 | Payer: MEDICARE

## 2021-10-30 VITALS — BP 131/80 | HR 94 | Temp 97.20000°F | Resp 16

## 2021-10-30 MED ORDER — DENOSUMAB 60 MG/ML SC SYRG
60 mg | Freq: Once | SUBCUTANEOUS | 0 refills | Status: CP
Start: 2021-10-30 — End: ?
  Administered 2021-10-30: 19:00:00 60 mg via SUBCUTANEOUS

## 2021-10-30 MED ORDER — DENOSUMAB 60 MG/ML SC SYRG
60 mg | Freq: Once | SUBCUTANEOUS | 0 refills | Status: CN
Start: 2021-10-30 — End: ?

## 2021-10-31 DIAGNOSIS — M810 Age-related osteoporosis without current pathological fracture: Principal | ICD-10-CM

## 2022-01-20 ENCOUNTER — Encounter: Admit: 2022-01-20 | Discharge: 2022-01-20 | Payer: MEDICARE

## 2022-01-20 DIAGNOSIS — G40319 Generalized idiopathic epilepsy and epileptic syndromes, intractable, without status epilepticus: Secondary | ICD-10-CM

## 2022-01-20 MED ORDER — LEVETIRACETAM 100 MG/ML PO SOLN
3 refills
Start: 2022-01-20 — End: ?

## 2022-01-25 ENCOUNTER — Encounter: Admit: 2022-01-25 | Discharge: 2022-01-25 | Payer: MEDICARE

## 2022-01-25 MED ORDER — CLORAZEPATE DIPOTASSIUM 3.75 MG PO TAB
ORAL_TABLET | ORAL | 5 refills | 4.00000 days | Status: AC
Start: 2022-01-25 — End: ?

## 2022-03-13 ENCOUNTER — Encounter: Admit: 2022-03-13 | Discharge: 2022-03-13 | Payer: MEDICARE

## 2022-03-13 MED ORDER — GLYCOPYRROLATE 1 MG PO TAB
ORAL_TABLET | 3 refills
Start: 2022-03-13 — End: ?

## 2022-06-08 ENCOUNTER — Encounter: Admit: 2022-06-08 | Discharge: 2022-06-08 | Payer: No Typology Code available for payment source

## 2022-06-08 DIAGNOSIS — M810 Age-related osteoporosis without current pathological fracture: Secondary | ICD-10-CM

## 2022-06-09 ENCOUNTER — Encounter: Admit: 2022-06-09 | Discharge: 2022-06-09 | Payer: No Typology Code available for payment source

## 2022-06-10 ENCOUNTER — Encounter: Admit: 2022-06-10 | Discharge: 2022-06-10 | Payer: No Typology Code available for payment source

## 2022-06-10 DIAGNOSIS — G40319 Generalized idiopathic epilepsy and epileptic syndromes, intractable, without status epilepticus: Secondary | ICD-10-CM

## 2022-06-10 MED ORDER — LAMOTRIGINE 100 MG PO TAB
ORAL_TABLET | 3 refills
Start: 2022-06-10 — End: ?

## 2022-06-11 ENCOUNTER — Encounter: Admit: 2022-06-11 | Discharge: 2022-06-11 | Payer: No Typology Code available for payment source

## 2022-07-12 ENCOUNTER — Encounter: Admit: 2022-07-12 | Discharge: 2022-07-12 | Payer: No Typology Code available for payment source

## 2022-07-12 MED ORDER — CLORAZEPATE DIPOTASSIUM 3.75 MG PO TAB
ORAL_TABLET | 0 refills
Start: 2022-07-12 — End: ?

## 2022-08-05 ENCOUNTER — Encounter: Admit: 2022-08-05 | Discharge: 2022-08-05 | Payer: No Typology Code available for payment source

## 2022-08-14 NOTE — Patient Instructions
--  Preferred method of communication is through [employer], if the issue cannot wait until your next scheduled follow up.   -- MyChart may be used for non-emergent communication. Emails are not reviewed after hours or over the weekend/holidays/after 4PM. Staff will reply to your email within 24-48 business hours.     -- If you do not hear from [place] within one week of a lab or imaging study being completed, please call/send my chart email to the office to be sure that we have received the results. This is especially challenging when tests are done outside of the [place] system, as many times results do not make it back to our office for a variety of reasons. In our office no news is good news does not apply. You should hear from [place] with results for each test.  [facility] lab/imaging results:  Due to the CARES act, results automatically release to MyChart.  Dr. Philis Kendall will continue to send you a result note on any labs that she orders.  With these changes you may see your results before Dr. Philis Kendall does. Please allow up to 72 hours for review and response to your results.     -- If you are having acute (new/sudden onset) or severe/worsening neurologic symptoms, please call 911 or seek care in ED.    -- For scheduling of IMAGING/RADIOLOGY, please call [phone] at your convenience to schedule your studies.  -- For referrals placed during the visit, if you have not heard from scheduling within one week, please call the call center at [phone] to get scheduling assistance.  -- For refills on medications, please first contact your pharmacy, who will fax a refill authorization request form to our office.  Weekdays only. Allow up to 2 business days for refills. Please plan ahead, as refills will not be filled after hours.  -- Our scheduling staff, may be reached at [phone] for scheduling needs.   -- Your nurse Delice Bison, may be contacted at [phone] for urgent needs. Staff will return your call within 24 business hours. For Appointments:   -- Please try to arrive early for your appointment time to help facilitate your visit. 15 minutes early is recommended.   -- If you are late to your appointment, we reserve the right to ask you to reschedule or wait until next available time to be seen in fairness to other patients scheduled that day.   -- There are times when we are running behind in clinic. Our goal is to always be on time, however, there are time when unexpected events occur with patients, which may cause a delay. We appreciate your understanding when this occurs.

## 2022-08-16 ENCOUNTER — Encounter: Admit: 2022-08-16 | Discharge: 2022-08-16 | Payer: No Typology Code available for payment source

## 2022-08-16 DIAGNOSIS — G40319 Generalized idiopathic epilepsy and epileptic syndromes, intractable, without status epilepticus: Secondary | ICD-10-CM

## 2022-08-16 MED ORDER — LEVETIRACETAM 100 MG/ML PO SOLN
ORAL | 7 refills | 90.00000 days | Status: AC
Start: 2022-08-16 — End: ?

## 2022-08-19 ENCOUNTER — Encounter: Admit: 2022-08-19 | Discharge: 2022-08-19 | Payer: No Typology Code available for payment source

## 2022-08-19 ENCOUNTER — Ambulatory Visit: Admit: 2022-08-19 | Discharge: 2022-08-20 | Payer: No Typology Code available for payment source

## 2022-08-19 DIAGNOSIS — R569 Unspecified convulsions: Secondary | ICD-10-CM

## 2022-08-19 DIAGNOSIS — G809 Cerebral palsy, unspecified: Secondary | ICD-10-CM

## 2022-08-19 DIAGNOSIS — Q909 Down syndrome, unspecified: Secondary | ICD-10-CM

## 2022-08-19 DIAGNOSIS — E039 Hypothyroidism, unspecified: Secondary | ICD-10-CM

## 2022-08-19 DIAGNOSIS — K319 Disease of stomach and duodenum, unspecified: Secondary | ICD-10-CM

## 2022-08-19 DIAGNOSIS — G40109 Localization-related (focal) (partial) symptomatic epilepsy and epileptic syndromes with simple partial seizures, not intractable, without status epilepticus: Secondary | ICD-10-CM

## 2022-08-19 DIAGNOSIS — K5909 Other constipation: Secondary | ICD-10-CM

## 2022-08-19 DIAGNOSIS — R001 Bradycardia, unspecified: Secondary | ICD-10-CM

## 2022-08-19 DIAGNOSIS — Q899 Congenital malformation, unspecified: Secondary | ICD-10-CM

## 2022-08-19 DIAGNOSIS — K3184 Gastroparesis: Secondary | ICD-10-CM

## 2022-08-19 DIAGNOSIS — Z931 Gastrostomy status: Secondary | ICD-10-CM

## 2022-08-19 DIAGNOSIS — H9190 Unspecified hearing loss, unspecified ear: Secondary | ICD-10-CM

## 2022-08-19 DIAGNOSIS — G40319 Generalized idiopathic epilepsy and epileptic syndromes, intractable, without status epilepticus: Secondary | ICD-10-CM

## 2022-08-19 DIAGNOSIS — M810 Age-related osteoporosis without current pathological fracture: Secondary | ICD-10-CM

## 2022-08-19 NOTE — Progress Notes
Telehealth Visit Note    Date of Service: 08/19/2022    Subjective:      Marcus Gibbs is a 46 y.o. male with history of Down syndrome and epilepsy since infancy.  This is a follow-up for his epilepsy. Hasson's mother Mrs. Salberg answered all questions on behalf of Marcus Gibbs. Patient's name and date of birth of were confirmed         [name] is a 46 y.o. male.    History of Present Illness    Since his last visit,  approximately a year ago, Mrs. Pha reports that Marcus Gibbs has overall been doing well.  She does note that approximately 4 days ago he was hospitalized due to urinary tract infection.  She did not feel that he overall looked ill however noted that he had a fever and therefore sent him to the hospital.  He was later found to have UTI, pneumonia and sepsis.  During that time he did not have any increase in seizure frequency.  He has since returned home and is doing well.    Regarding his seizures, Mrs. Cabiness states that Marcus Gibbs has not had a generalized tonic-clonic seizure in several years.  He does occasionally have eye twitching however it is not bothersome to the patient.  Today, he is interactive and playing with his toys.  She has no complaints regarding his seizures.  He continues to take the following antiseizure medication regimen:  - Keppra 2,000mg  AM and 1,000mg  PM  - Lamictal 100 mg BID   - Tranxene 3.75mg  TID     Ms. Fuell states that she herself was recently ill.  She was diagnosed with anemia and thyroid.  She has since been doing better however does not drive anymore.  Her husband is also ill and may need surgery.    No other neurologic concerns at this time.       Review of Systems  A 10 point ROS is negative per patient's mother  .  Objective:          calcium carbonate (TUMS E-X) 300 mg (750 mg) chewable tablet Chew one tablet by mouth daily. Puts in cereal    calcium carbonate/vitamin D3 (VITAMIN D-3 PO) Take 3 tablespoons by feeding tube twice weekly (on Tuesday and Friday)    1 serving = 2 Tablespoons  Calcium citrate: 1,000 mg per serving  Magnesium (doesn't indicate amount)  Vitamin D 25 mg per serving    chlorhexidine gluconate (PERIDEX) 0.12 % solution Take 15 mL by mouth twice daily.    clorazepate dipotassium (TRANXENE T-TAB) 3.75 mg tablet GIVE 1 TABLET VIA FEEDING TUBE 3 TIMES A DAY    denosumab (PROLIA) 60 mg/mL injection Inject subcutaneous every 6 months  Indications: every 6 months    famotidine (PEPCID) 40 mg/5 mL (8 mg/mL) susp oral suspension Take 5 mL by mouth twice daily.    fexofenadine (ALLEGRA) 30 mg/5 mL suspension Take 10 mL by mouth twice daily.    fluticasone (FLONASE) 50 mcg/actuation nasal spray Apply two sprays to each nostril as directed daily.    glycopyrrolate (ROBINUL) 1 mg tablet TAKE 1 TABLET BY MOUTH THREE TIMES A DAY    LACTOSE-REDUCED FOOD (ENSURE PO) Take 2 Cans by mouth daily.    lamoTRIgine (LAMICTAL) 100 mg tablet TAKE 1 TABLET PER NG TUBE TWICE DAILY    levETIRAcetam (KEPPRA) 100 mg/mL oral solution GIVE 20 ML VIA FEEDING GASTROSTOMY IN THE MORNING AND AT NIGHT    levothyroxine (  SYNTHROID) 50 mcg tablet Take one tablet by mouth daily 30 minutes before breakfast.    nitrofurantoin macrocrystaL (MACRODANTIN) 50 mg capsule Take one capsule by mouth at bedtime daily. Take with food.    Nutritional Supplements liqd Puts in ensure product. 1 packet per day.    omeprazole DR (PRILOSEC) 20 mg capsule Take one capsule by mouth daily as needed.    polyethylene glycol 3350 (MIRALAX) 17 g packet Take one packet via feeding tube at bedtime daily.          Telehealth Patient Reported Vitals       Row Name 08/19/22 0957                Weight: 31.8 kg (70 lb)        Pain Score: One  patient non verbal                      Telehealth Body Mass Index: 0 at 08/19/2022 12:40 PM    Physical Exam  Patient awake, alert and smiling.  Does not follow commands however spontaneously moving right upper extremity.  Symmetric facial expression.        Assessment and Plan:    Marcus Gibbs is a 46 y.o. male with history of Down syndrome and epilepsy since infancy.  This is a follow-up for his epilepsy.     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 Seizures (HCC)        3. Localization-related epilepsy (HCC)        4. Down syndrome           PLAN  > Continue Keppra 2000mg  AM and 1,000mg  PM and Lamictal 100mg  BID for epilepsy  > Continue glycopyrrolate 1mg  TID for oral secretions  > Continue Tranxene 3.75mg  TID for mood/behavior     Patient seen and plan discussed with Dr. Genevie Cheshire     RTC in 1 year. Contact clinic if there are any concerns.      Pincus Large, DO   PGY4, Neurology Resident     20 minutes spent on this patient's encounter with counseling and coordination of care taking >50% of the visit.

## 2022-08-19 NOTE — Progress Notes
I have personally interviewed the mother of Marcus Gibbs and reviewed the history, examination, impression, and plan of care as outlined by Dr.Natalie Bartnik, DO. I personally  participated in counseling and coordination of care.  I agree with the assessment and plan as documented by Dr. Pincus Large, DO.

## 2022-09-07 ENCOUNTER — Encounter: Admit: 2022-09-07 | Discharge: 2022-09-07 | Payer: No Typology Code available for payment source

## 2023-01-12 ENCOUNTER — Encounter: Admit: 2023-01-12 | Discharge: 2023-01-12 | Payer: No Typology Code available for payment source

## 2023-01-12 MED ORDER — CLORAZEPATE DIPOTASSIUM 3.75 MG PO TAB
ORAL_TABLET | 0 refills
Start: 2023-01-12 — End: ?

## 2023-01-13 ENCOUNTER — Encounter: Admit: 2023-01-13 | Discharge: 2023-01-13 | Payer: No Typology Code available for payment source

## 2023-01-13 MED ORDER — GLYCOPYRROLATE 1 MG PO TAB
ORAL_TABLET | 3 refills
Start: 2023-01-13 — End: ?

## 2023-01-29 ENCOUNTER — Encounter: Admit: 2023-01-29 | Discharge: 2023-01-29 | Payer: No Typology Code available for payment source

## 2023-01-29 DIAGNOSIS — G40319 Generalized idiopathic epilepsy and epileptic syndromes, intractable, without status epilepticus: Secondary | ICD-10-CM

## 2023-01-29 MED ORDER — LEVETIRACETAM 100 MG/ML PO SOLN
4 refills
Start: 2023-01-29 — End: ?

## 2023-07-08 ENCOUNTER — Encounter: Admit: 2023-07-08 | Discharge: 2023-07-08 | Payer: Medicare Other

## 2023-07-08 DIAGNOSIS — G40319 Generalized idiopathic epilepsy and epileptic syndromes, intractable, without status epilepticus: Secondary | ICD-10-CM

## 2023-07-08 MED ORDER — LAMOTRIGINE 100 MG PO TAB
ORAL_TABLET | 3 refills | Status: AC
Start: 2023-07-08 — End: ?

## 2023-07-11 ENCOUNTER — Encounter: Admit: 2023-07-11 | Discharge: 2023-07-11 | Payer: MEDICAID

## 2023-08-19 ENCOUNTER — Encounter: Admit: 2023-08-19 | Discharge: 2023-08-19 | Payer: MEDICAID

## 2023-08-19 ENCOUNTER — Ambulatory Visit: Admit: 2023-08-19 | Discharge: 2023-08-20 | Payer: MEDICAID

## 2023-10-13 ENCOUNTER — Encounter: Admit: 2023-10-13 | Discharge: 2023-10-13 | Payer: MEDICAID

## 2023-11-07 ENCOUNTER — Encounter: Admit: 2023-11-07 | Discharge: 2023-11-07 | Payer: MEDICAID

## 2023-11-10 ENCOUNTER — Encounter: Admit: 2023-11-10 | Discharge: 2023-11-10 | Payer: MEDICAID

## 2024-03-11 ENCOUNTER — Encounter: Admit: 2024-03-11 | Discharge: 2024-03-11 | Payer: MEDICAID

## 2024-03-12 ENCOUNTER — Encounter: Admit: 2024-03-12 | Discharge: 2024-03-12 | Payer: MEDICAID

## 2024-03-31 ENCOUNTER — Encounter: Admit: 2024-03-31 | Discharge: 2024-03-31 | Payer: MEDICAID

## 2024-07-13 ENCOUNTER — Ambulatory Visit: Admit: 2024-07-13 | Discharge: 2024-07-14 | Payer: MEDICARE

## 2024-07-13 ENCOUNTER — Encounter: Admit: 2024-07-13 | Discharge: 2024-07-13 | Payer: MEDICAID

## 2024-07-13 DIAGNOSIS — G40319 Generalized idiopathic epilepsy and epileptic syndromes, intractable, without status epilepticus: Principal | ICD-10-CM

## 2024-07-13 MED ORDER — CLORAZEPATE DIPOTASSIUM 3.75 MG PO TAB
3.75 mg | ORAL_TABLET | Freq: Three times a day (TID) | GASTROSTOMY | 5 refills | 4.00000 days | Status: AC
Start: 2024-07-13 — End: ?

## 2024-07-13 MED ORDER — GLYCOPYRROLATE 1 MG PO TAB
1 mg | ORAL_TABLET | Freq: Three times a day (TID) | ORAL | 3 refills | 90.00000 days | Status: AC
Start: 2024-07-13 — End: ?

## 2024-07-13 MED ORDER — LEVETIRACETAM 100 MG/ML PO SOLN
2000 mg | Freq: Two times a day (BID) | GASTROSTOMY | 4 refills | 90.00000 days | Status: AC
Start: 2024-07-13 — End: ?

## 2024-07-13 MED ORDER — LAMOTRIGINE 100 MG PO TAB
100 mg | ORAL_TABLET | Freq: Two times a day (BID) | GASTROSTOMY | 3 refills | 30.00000 days | Status: AC
Start: 2024-07-13 — End: ?

## 2024-07-13 NOTE — Progress Notes [1]
 Telehealth Visit Note    Date of Service: 07/13/2024    Subjective:         Marcus Gibbs is a 48 y.o. male with history of Down Syndrome, global delay, epilepsy since infancy.  This is a follow-up for his epilepsy.     Unfortunately Marcus Gibbs, this mother, struggled to connect to video conference today so the visit was held over the phone.     Reports that Marcus Gibbs has been doing really well lately. She thinks he has gained some weight, around 70 lbs, looks and acts healthy. Unfortunately in 03-23-2024 Marcus Gibbs's dad passed away. Marcus Gibbs's older brother lives in the home which has been a big help. No concerns for seizures lately with Marcus Gibbs, she has not noticed him having any. He has always had constipation takes miralax for this. Had recent external urinary cath replaced which went well.     We confirmed dosages of the medication he is taking. No concerns for side effects. She finds it difficult to get Marcus Gibbs in the car by herself so we discussed that it's ok to keep up with telehealth for now, if she has new concerns in the future would always be ok to come in person if her other son is able to help out.         Objective:          calcium carbonate (TUMS E-X) 300 mg (750 mg) chewable tablet Chew one tablet by mouth daily. Puts in cereal    calcium carbonate/vitamin D3 (VITAMIN D-3 PO) Take 3 tablespoons by feeding tube twice weekly (on Tuesday and Friday)    1 serving = 2 Tablespoons  Calcium citrate: 1,000 mg per serving  Magnesium (doesn't indicate amount)  Vitamin D 25 mg per serving    chlorhexidine gluconate (PERIDEX) 0.12 % solution Take 15 mL by mouth twice daily.    clorazepate  dipotassium (TRANXENE  T-TAB) 3.75 mg tablet Take one tablet via feeding tube three times daily.    denosumab  (PROLIA ) 60 mg/mL injection Inject subcutaneous every 6 months  Indications: every 6 months    famotidine (PEPCID) 40 mg/5 mL (8 mg/mL) susp oral suspension Take 5 mL by mouth twice daily.    fexofenadine (ALLEGRA) 30 mg/5 mL suspension Take 10 mL by mouth twice daily.    fluticasone (FLONASE) 50 mcg/actuation nasal spray Apply two sprays to each nostril as directed daily.    glycopyrrolate  (ROBINUL ) 1 mg tablet Take one tablet by mouth three times daily.    LACTOSE-REDUCED FOOD (ENSURE PO) Take 2 Cans by mouth daily.    lamoTRIgine  (LAMICTAL ) 100 mg tablet Take one tablet via feeding tube twice daily.    levETIRAcetam  (KEPPRA ) 100 mg/mL oral solution Take 20 mL via feeding tube twice daily.    levothyroxine  (SYNTHROID) 50 mcg tablet Take one tablet by mouth daily 30 minutes before breakfast.    nitrofurantoin macrocrystaL (MACRODANTIN) 50 mg capsule Take one capsule by mouth at bedtime daily. Take with food.    Nutritional Supplements liqd Puts in ensure product. 1 packet per day.    omeprazole DR (PRILOSEC) 20 mg capsule Take one capsule by mouth daily as needed.    polyethylene glycol 3350 (MIRALAX) 17 g packet Take one packet via feeding tube at bedtime daily.           Computed Telehealth Body Mass Index unavailable. One or more values for this score either were not found within the given timeframe or did not fit some other criterion.  Physical Exam         Assessment and Plan:    Marcus Gibbs is a 48 y.o. male with history of Down syndrome and epilepsy since infancy.  This is a follow-up for his epilepsy. No concern for those brief tonic seizures qmonthly reported at last visit. These were not concerning enough to mom to make changes at that time. Will plan to keep everything the same. Mom struggles to get Marcus Gibbs into the car to travel for in-person visits, so may continue with telehealth unless new concerns.    1. 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 Seizures (HCC)          3. Localization-related epilepsy (HCC)          4. Down syndrome             PLAN  > Continue Keppra  2000mg  AM and 1,000mg  PM and Lamictal  100mg  BID for epilepsy  > Continue glycopyrrolate  1mg  TID for oral secretions  > Continue Tranxene  3.75mg  TID for mood/behavior     RTC via Telehealth in 1 year or sooner if concerns arise    Discussed with Dr. Conda Fay Shaker, MD  Neurology PGY3

## 2024-09-05 ENCOUNTER — Encounter: Admit: 2024-09-05 | Discharge: 2024-09-05 | Payer: MEDICAID

## 2024-09-05 MED ORDER — CLORAZEPATE DIPOTASSIUM 3.75 MG PO TAB
3.75 mg | ORAL_TABLET | Freq: Three times a day (TID) | GASTROSTOMY | 5 refills | 4.00000 days | Status: AC
Start: 2024-09-05 — End: ?
